# Patient Record
Sex: Male | Born: 1985 | Race: Black or African American | Hispanic: No | Marital: Single | Smoking: Current every day smoker
Health system: Southern US, Community
[De-identification: ages and names within clinical notes are randomized; demographics above are authoritative.]

## PROBLEM LIST (undated history)

## (undated) DIAGNOSIS — F419 Anxiety disorder, unspecified: Secondary | ICD-10-CM

## (undated) DIAGNOSIS — U071 COVID-19: Secondary | ICD-10-CM

## (undated) DIAGNOSIS — B20 Human immunodeficiency virus [HIV] disease: Secondary | ICD-10-CM

## (undated) DIAGNOSIS — F32A Depression, unspecified: Secondary | ICD-10-CM

## (undated) DIAGNOSIS — I10 Essential (primary) hypertension: Secondary | ICD-10-CM

## (undated) DIAGNOSIS — S62319A Displaced fracture of base of unspecified metacarpal bone, initial encounter for closed fracture: Secondary | ICD-10-CM

## (undated) DIAGNOSIS — F4310 Post-traumatic stress disorder, unspecified: Secondary | ICD-10-CM

## (undated) DIAGNOSIS — K219 Gastro-esophageal reflux disease without esophagitis: Secondary | ICD-10-CM

## (undated) HISTORY — DX: Anxiety disorder, unspecified: F41.9

## (undated) HISTORY — DX: Post-traumatic stress disorder, unspecified: F43.10

## (undated) HISTORY — PX: NO PAST SURGERIES: SHX2092

## (undated) HISTORY — DX: COVID-19: U07.1

## (undated) HISTORY — DX: Depression, unspecified: F32.A

## (undated) HISTORY — DX: Essential (primary) hypertension: I10

---

## 2015-04-19 ENCOUNTER — Emergency Department (HOSPITAL_COMMUNITY)
Admission: EM | Admit: 2015-04-19 | Discharge: 2015-04-19 | Disposition: A | Discharge status: Home / Self Care | Payer: Self-pay | Attending: Emergency Medicine | Admitting: Emergency Medicine

## 2015-04-19 ENCOUNTER — Encounter (HOSPITAL_COMMUNITY): Payer: Self-pay | Admitting: Emergency Medicine

## 2015-04-19 DIAGNOSIS — Z720 Tobacco use: Secondary | ICD-10-CM | POA: Insufficient documentation

## 2015-04-19 DIAGNOSIS — M25511 Pain in right shoulder: Secondary | ICD-10-CM

## 2015-04-19 DIAGNOSIS — M7521 Bicipital tendinitis, right shoulder: Secondary | ICD-10-CM | POA: Insufficient documentation

## 2015-04-19 MED ORDER — ONDANSETRON 4 MG PO TBDP
4.0000 mg | ORAL_TABLET | Freq: Once | ORAL | Status: AC
  Administered 2015-04-19: 4 mg via ORAL
  Filled 2015-04-19: qty 1

## 2015-04-19 MED ORDER — CYCLOBENZAPRINE HCL 10 MG PO TABS: 10.0000 mg | ORAL_TABLET | Freq: Two times a day (BID) | ORAL | Status: DC | PRN

## 2015-04-19 MED ORDER — OXYCODONE-ACETAMINOPHEN 5-325 MG PO TABS
2.0000 | ORAL_TABLET | Freq: Once | ORAL | Status: AC
  Administered 2015-04-19: 2 via ORAL
  Filled 2015-04-19: qty 2

## 2015-04-19 MED ORDER — NAPROXEN 375 MG PO TABS: 375.0000 mg | ORAL_TABLET | Freq: Two times a day (BID) | ORAL | Status: DC

## 2015-04-19 MED ORDER — HYDROCODONE-ACETAMINOPHEN 5-325 MG PO TABS: 2.0000 | ORAL_TABLET | Freq: Four times a day (QID) | ORAL | Status: DC | PRN

## 2015-04-19 NOTE — ED Provider Notes (Signed)
CSN: [id number]     Arrival date & time 04/19/15  1003 History  This chart was scribed for non-physician practitioner, Arthor Captain, PA-C, working with Richardean Canal, MD, by Ronney Lion, ED Scribe. This patient was seen in room TR10C/TR10C and the patient's care was started at 11:16 AM.      Chief Complaint  Patient presents with  . Shoulder Pain   The history is provided by the patient. No language interpreter was used.   HPI Comments: Allen [name]. is a 29 y.o. male who presents to the Emergency Department complaining of constant, bilateral shoulder pain, right greater than left, radiating up his neck that began 2 days ago. Patient states his left shoulder is moderately sore, but his right shoulder is severely stabbing in quality. He denies any specific trauma or injury but states he recently moved in to a new apartment, which required heavy lifting, and suspects he may have slept wrong. Patient has tried Tylenol PM, ibuprofen, and muscle relaxants with no relief. Patient states he played spots when he was younger and states he has injured his shoulder in the past, but the pain has never been as severe as it is today. Patient has NKDA. He states he is not driving today.  History reviewed. No pertinent past medical history. History reviewed. No pertinent past surgical history. No family history on file. Social History  Substance Use Topics  . Smoking status: Current Every Day Smoker -- 0.50 packs/day  . Smokeless tobacco: None  . Alcohol Use: 1.2 oz/week    2 Shots of liquor per week    Review of Systems  Musculoskeletal: Positive for arthralgias (bilateral shoulder pain, right greater than left).    Allergies  Review of patient's allergies indicates no known allergies.  Home Medications   Prior to Admission medications   Not on File   BP 144/88 mmHg  Pulse 72  Temp(Src) 98.4 F (36.9 C) (Oral)  Resp 20  Ht  (1.727 m)  Wt 214 lb 5 oz (97.212 kg)  BMI 32.59 kg/m2   SpO2 100% Physical Exam  Constitutional: He is oriented to person, place, and time. He appears well-developed and well-nourished. No distress.  HENT:  Head: Normocephalic and atraumatic.  Eyes: Conjunctivae and EOM are normal.  Neck: Neck supple. No tracheal deviation present.  Cardiovascular: Normal rate.   Pulmonary/Chest: Effort normal. No respiratory distress.  Musculoskeletal: He exhibits tenderness.  TTP over the proximal short head of the biceps tendon. Pain with any flexion, and unable to range the shoulder due to pain at this time. TTP to pec wall and trapezius with some muscle spasm, which is likely due to guarding.  Neurological: He is alert and oriented to person, place, and time.  Skin: Skin is warm and dry.  Psychiatric: He has a normal mood and affect. His behavior is normal.  Nursing note and vitals reviewed.   ED Course  Procedures (including critical care time)  DIAGNOSTIC STUDIES: Oxygen Saturation is 100% on RA, normal by my interpretation.    COORDINATION OF CARE: 11:20 AM - Suspect biceps tendinitis. Discussed treatment plan with pt at bedside which includes Rx anti-inflammatory medications, placement in a sling, and f/u with orthopedist if needed. Pt verbalized understanding and agreed to plan.   MDM   Final diagnoses:  None    Patient X-Ray negative for obvious fracture or dislocation. Pain managed in ED. Pt advised to follow up with orthopedics if symptoms persist for possibility of missed fracture  diagnosis. Patient given brace while in ED, conservative therapy recommended and discussed. Patient will be dc home & is agreeable with above plan.  I personally performed the services described in this documentation, which was scribed in my presence. The recorded information has been reviewed and is accurate.       Arthor Captain, PA-C 04/19/15 1645  Richardean Canal, MD 04/19/15 2019

## 2015-04-19 NOTE — Discharge Instructions (Signed)
Ice your shoulder 20 min at a time 3-4 times a day. Take the naproxen as directed. Use the norco only for severe pain.  Biceps Tendon Tendinitis (Proximal) and Tenosynovitis with Rehab Tendonitis and tenosynovitis involve inflammation of the tendon and the tendon lining (sheath). The proximal biceps tendon is vulnerable to tendonitis and tenosynovitis, which causes pain and discomfort in the front of the shoulder and upper arm. The tendon lining secretes a fluid that helps lubricate the tendon, allowing for proper function without pain. When the tendon and its lining become inflamed, the tendon can no longer glide smoothly, causing pain. The proximal biceps tendon connects the biceps muscle to two bones of the shoulder. It is important for proper function of the elbow and turning the palm upward (supination) using the wrist. Proximal biceps tendon tendinitis may include a grade 1 or 2 strain of the tendon. Grade 1 strains involve a slight pull of the tendon without signs of tearing and no observed tendon lengthening. There is also no loss of strength. Grade 2 strains involve small tears in the tendon fibers. The tendon or muscle is stretched and strength is usually decreased.  SYMPTOMS   Pain, tenderness, swelling, warmth, or redness over the front of the shoulder.  Pain that gets worse with shoulder and elbow use, especially against resistance.  Limited motion of the shoulder or elbow.  Crackling sound (crepitation) when the tendon or shoulder is moved or touched. CAUSES  The symptoms of biceps tendonitis are due to inflammation of the tendon. Inflammation may be caused by:  Strain from sudden increase in amount or intensity of activity.  Direct blow or injury to the elbow (uncommon).  Overuse or repetitive elbow bending or wrist rotation, particularly when turning the palm up, or with elbow hyperextension. RISK INCREASES WITH:  Sports that involve contact or overhead arm activity (throwing  sports, gymnastics, weightlifting, bodybuilding, rock climbing).  Heavy labor.  Poor strength and flexibility.  Failure to warm up properly before activity. PREVENTION  Warm up and stretch properly before activity.  Allow time for recovery between activities.  Maintain physical fitness:  Strength, flexibility, and endurance.  Cardiovascular fitness.  Learn and use proper exercise technique. PROGNOSIS  With proper treatment, proximal biceps tendon tendonitis and tenosynovitis is usually curable within 6 weeks. Healing is usually quicker if the cause was a direct blow, not overuse.  RELATED COMPLICATIONS   Longer healing time if not properly treated or if not given enough time to heal.  Chronically inflamed tendon that causes persistent pain with activity, that may progress to constant pain and potentially rupture of the tendon.  Recurring symptoms, especially if activity is resumed too soon or with overuse, a direct blow, or use of poor exercise technique. TREATMENT Treatment first involves ice and medicine, to reduce pain and inflammation. It is helpful to modify activities that cause pain, to reduce the chances of causing the condition to get worse. Strengthening and stretching exercises should be performed to promote proper use of the muscles of the shoulder. These exercises may be performed at home or with a therapist. Other treatments may be given such as ultrasound or heat therapy. A corticosteroid injection may be recommended to help reduce inflammation of the tendon lining. Surgery is usually not necessary. Sometimes, if symptoms last for greater than 6 months, surgery will be advised to detach the tendon and re-insert it into the arm bone. Surgery to correct other shoulder problems that may be contributing to tendinitis may be advised  before surgery for the tendinitis itself.  MEDICATION  If pain medicine is needed, nonsteroidal anti-inflammatory medicines (aspirin and  ibuprofen), or other minor pain relievers (acetaminophen), are often advised.  Do not take pain medicine for 7 days before surgery.  Prescription pain relievers may be given if your caregiver thinks they are needed. Use only as directed and only as much as you need.  Corticosteroid injections may be given. These injections should only be used on the most severe cases, as one can only receive a limited number of them. HEAT AND COLD   Cold treatment (icing) should be applied for 10 to 15 minutes every 2 to 3 hours for inflammation and pain, and immediately after activity that aggravates your symptoms. Use ice packs or an ice massage.  Heat treatment may be used before performing stretching and strengthening activities prescribed by your caregiver, physical therapist, or athletic trainer. Use a heat pack or a warm water soak. SEEK MEDICAL CARE IF:   Symptoms get worse or do not improve in 2 weeks, despite treatment.  New, unexplained symptoms develop. (Drugs used in treatment may produce side effects.) EXERCISES RANGE OF MOTION (ROM) AND EXERCISES - Biceps Tendon (Proximal) and Tenosynovitis These exercises may help you when beginning to rehabilitate your injury. Your symptoms may go away with or without further involvement from your physician, physical therapist, or athletic trainer. While completing these exercises, remember:   Restoring tissue flexibility helps normal motion to return to the joints. This allows healthier, less painful movement and activity.  An effective stretch should be held for at least 30 seconds.  A stretch should never be painful. You should only feel a gentle lengthening or release in the stretched tissue. STRETCH - Flexion, Standing  Stand with good posture. With an underhand grip on your right / left hand and an overhand grip on the opposite hand, grasp a broomstick or cane so that your hands are a little more than shoulder width apart.  Keeping your right /  left elbow straight and shoulder muscles relaxed, push the stick with your opposite hand to raise your right / left arm in front of your body and then overhead. Raise your arm until you feel a stretch in your right / left shoulder, but before you have increased shoulder pain.  Try to avoid shrugging your right / left shoulder as your arm rises, by keeping your shoulder blade tucked down and toward your mid-back spine. Hold for __________ seconds.  Slowly return to the starting position. Repeat __________ times. Complete this exercise __________ times per day. STRETCH - Abduction, Supine  Lie on your back. With an underhand grip on your right / left hand and an overhand grip on the opposite hand, grasp a broomstick or cane so that your hands are a little more than shoulder width apart.  Keeping your right / left elbow straight and shoulder muscles relaxed, push the stick with your opposite hand to raise your right / left arm out to the side of your body and then overhead. Raise your arm until you feel a stretch in your right / left shoulder, but before you have increased shoulder pain.  Try to avoid shrugging your right / left shoulder as your arm rises, by keeping your shoulder blade tucked down and toward your mid-back spine. Hold for __________ seconds.  Slowly return to the starting position. Repeat __________ times. Complete this exercise __________ times per day. ROM - Flexion, Active-Assisted  Lie on your back. You may  bend your knees for comfort.  Grasp a broomstick or cane so your hands are about shoulder width apart. Your right / left hand should grip the end of the stick so that your hand is positioned "thumbs-up," as if you were about to shake hands.  Using your healthy arm to lead, raise your right / left arm overhead until you feel a gentle stretch in your shoulder. Hold for __________ seconds.  Use the stick to assist in returning your right / left arm to its starting  position. Repeat __________ times. Complete this exercise __________ times per day.  STRETCH - Flexion, Standing   Stand facing a wall. Walk your right / left fingers up the wall until you feel a moderate stretch in your shoulder. As your hand gets higher, you may need to step closer to the wall or use a door frame to walk through.  Try to avoid shrugging your right / left shoulder as your arm rises, by keeping your shoulder blade tucked down and toward your mid-back spine.  Hold for __________ seconds. Use your other hand, if needed, to ease out of the stretch and return to the starting position. Repeat __________ times. Complete this exercise __________ times per day.  ROM - Internal Rotation   Using underhand grips, grasp a stick behind your back with both hands.  While standing upright with good posture, slide the stick up your back until you feel a mild stretch in the front of your shoulder.  Hold for __________ seconds. Slowly return to your starting position. Repeat __________ times. Complete this exercise __________ times per day.  STRETCH - Internal Rotation  Place your right / left hand behind your back, palm-up.  Throw a towel or belt over your opposite shoulder. Grasp the towel with your right / left hand.  While keeping an upright posture, gently pull up on the towel until you feel a stretch in the front of your right / left shoulder.  Avoid shrugging your right / left shoulder as your arm rises, by keeping your shoulder blade tucked down and toward your mid-back spine.  Hold for __________ seconds. Release the stretch by lowering your opposite hand. Repeat __________ times. Complete this exercise __________ times per day. STRENGTHENING EXERCISES - Biceps Tendon Tendinitis (Proximal) and Tenosynovitis These exercises may help you regain your strength after your physician has discontinued your restraint in a cast or brace. They may resolve your symptoms with or without  further involvement from your physician, physical therapist or athletic trainer. While completing these exercises, remember:   Muscles can gain both the endurance and the strength needed for everyday activities through controlled exercises.  Complete these exercises as instructed by your physician, physical therapist or athletic trainer. Increase the resistance and repetitions only as guided.  You may experience muscle soreness or fatigue, but the pain or discomfort you are trying to eliminate should never worsen during these exercises. If this pain does get worse, stop and make sure you are following the directions exactly. If the pain is still present after adjustments, discontinue the exercise until you can discuss the trouble with your caregiver. STRENGTH - Elbow Flexors, Isometric  Stand or sit upright on a firm surface. Place your right / left arm so that your hand is palm-up and at the height of your waist.  Place your opposite hand on top of your forearm. Gently push down as your right / left arm resists. Push as hard as you can with both arms, without  causing any pain or movement at your right / left elbow. Hold this stationary position for __________ seconds.  Gradually release the tension in both arms. Allow your muscles to relax completely before repeating. Repeat __________ times. Complete this exercise __________ times per day. STRENGTH - Shoulder Flexion, Isometric  With good posture and facing a wall, stand or sit about 4-6 inches away.  Keeping your right / left elbow straight, gently press the top of your fist into the wall. Increase the pressure gradually until you are pressing as hard as you can, without shrugging your shoulder or increasing any shoulder discomfort.  Hold for __________ seconds.  Release the tension slowly. Relax your shoulder muscles completely before you start the next repetition. Repeat __________ times. Complete this exercise __________ times per day.   STRENGTH - Elbow Flexors, Supinated  With good posture, stand or sit on a firm chair without armrests. Allow your right / left arm to rest at your side with your palm facing forward.  Holding a __________ weight, or gripping a rubber exercise band or tubing,  bring your hand toward your shoulder.  Allow your muscles to control the resistance as your hand returns to your side. Repeat __________ times. Complete this exercise __________ times per day.  STRENGTH - Shoulder Flexion  Stand or sit with good posture. Grasp a __________ weight, or an exercise band or tubing, so that your hand is "thumbs-up," like when you shake hands.  Slowly lift your right / left arm as far as you can, without increasing any shoulder pain. At first, many people can only raise their hand to shoulder height.  Avoid shrugging your right / left shoulder as your arm rises, by keeping your shoulder blade tucked down and toward your mid-back spine.  Hold for __________ seconds. Control the descent of your hand as you slowly return to your starting position. Repeat __________ times. Complete this exercise __________ times per day. Document Released: 07/26/2005 Document Revised: 10/18/2011 Document Reviewed: 11/07/2008 [facility] Patient Information 2015 [place], [place]. This information is not intended to replace advice given to you by your health care provider. Make sure you discuss any questions you have with your health care provider.  Shoulder Pain The shoulder is the joint that connects your arms to your body. The bones that form the shoulder joint include the upper arm bone (humerus), the shoulder blade (scapula), and the collarbone (clavicle). The top of the humerus is shaped like a ball and fits into a rather flat socket on the scapula (glenoid cavity). A combination of muscles and strong, fibrous tissues that connect muscles to bones (tendons) support your shoulder joint and hold the ball in the socket. Small,  fluid-filled sacs (bursae) are located in different areas of the joint. They act as cushions between the bones and the overlying soft tissues and help reduce friction between the gliding tendons and the bone as you move your arm. Your shoulder joint allows a wide range of motion in your arm. This range of motion allows you to do things like scratch your back or throw a ball. However, this range of motion also makes your shoulder more prone to pain from overuse and injury. Causes of shoulder pain can originate from both injury and overuse and usually can be grouped in the following four categories:  Redness, swelling, and pain (inflammation) of the tendon (tendinitis) or the bursae (bursitis).  Instability, such as a dislocation of the joint.  Inflammation of the joint (arthritis).  Broken bone (fracture).  HOME CARE INSTRUCTIONS   Apply ice to the sore area.  Put ice in a plastic bag.  Place a towel between your skin and the bag.  Leave the ice on for 15-20 minutes, 3-4 times per day for the first 2 days, or as directed by your health care provider.  Stop using cold packs if they do not help with the pain.  If you have a shoulder sling or immobilizer, wear it as long as your caregiver instructs. Only remove it to shower or bathe. Move your arm as little as possible, but keep your hand moving to prevent swelling.  Squeeze a soft ball or foam pad as much as possible to help prevent swelling.  Only take over-the-counter or prescription medicines for pain, discomfort, or fever as directed by your caregiver. SEEK MEDICAL CARE IF:   Your shoulder pain increases, or new pain develops in your arm, hand, or fingers.  Your hand or fingers become cold and numb.  Your pain is not relieved with medicines. SEEK IMMEDIATE MEDICAL CARE IF:   Your arm, hand, or fingers are numb or tingling.  Your arm, hand, or fingers are significantly swollen or turn white or blue. MAKE SURE YOU:   Understand  these instructions.  Will watch your condition.  Will get help right away if you are not doing well or get worse. Document Released: 05/05/2005 Document Revised: 12/10/2013 Document Reviewed: 07/10/2011 [facility] Patient Information 2015 [place], [place]. This information is not intended to replace advice given to you by your health care provider. Make sure you discuss any questions you have with your health care provider.

## 2015-04-19 NOTE — ED Notes (Signed)
Pt. Stated, I've been having shoulder pain for the last 2 days and it seems to be getting worse.  Last night it was so bafd I was crying.  Denies any injury.

## 2015-04-29 ENCOUNTER — Encounter (HOSPITAL_COMMUNITY): Payer: Self-pay | Admitting: *Deleted

## 2015-04-29 ENCOUNTER — Emergency Department (HOSPITAL_COMMUNITY): Payer: No Typology Code available for payment source

## 2015-04-29 ENCOUNTER — Emergency Department (HOSPITAL_COMMUNITY)
Admission: EM | Admit: 2015-04-29 | Discharge: 2015-04-29 | Disposition: A | Discharge status: Home / Self Care | Payer: No Typology Code available for payment source | Attending: Emergency Medicine | Admitting: Emergency Medicine

## 2015-04-29 DIAGNOSIS — Y9389 Activity, other specified: Secondary | ICD-10-CM | POA: Diagnosis not present

## 2015-04-29 DIAGNOSIS — S8992XA Unspecified injury of left lower leg, initial encounter: Secondary | ICD-10-CM | POA: Diagnosis not present

## 2015-04-29 DIAGNOSIS — Z720 Tobacco use: Secondary | ICD-10-CM | POA: Insufficient documentation

## 2015-04-29 DIAGNOSIS — Y999 Unspecified external cause status: Secondary | ICD-10-CM | POA: Insufficient documentation

## 2015-04-29 DIAGNOSIS — S4992XA Unspecified injury of left shoulder and upper arm, initial encounter: Secondary | ICD-10-CM | POA: Diagnosis not present

## 2015-04-29 DIAGNOSIS — Y92410 Unspecified street and highway as the place of occurrence of the external cause: Secondary | ICD-10-CM | POA: Insufficient documentation

## 2015-04-29 DIAGNOSIS — S3992XA Unspecified injury of lower back, initial encounter: Secondary | ICD-10-CM | POA: Insufficient documentation

## 2015-04-29 DIAGNOSIS — S0181XA Laceration without foreign body of other part of head, initial encounter: Secondary | ICD-10-CM

## 2015-04-29 DIAGNOSIS — S01111A Laceration without foreign body of right eyelid and periocular area, initial encounter: Secondary | ICD-10-CM | POA: Insufficient documentation

## 2015-04-29 DIAGNOSIS — Z791 Long term (current) use of non-steroidal anti-inflammatories (NSAID): Secondary | ICD-10-CM | POA: Insufficient documentation

## 2015-04-29 DIAGNOSIS — S0990XA Unspecified injury of head, initial encounter: Secondary | ICD-10-CM | POA: Diagnosis present

## 2015-04-29 DIAGNOSIS — S3991XA Unspecified injury of abdomen, initial encounter: Secondary | ICD-10-CM | POA: Diagnosis not present

## 2015-04-29 DIAGNOSIS — S299XXA Unspecified injury of thorax, initial encounter: Secondary | ICD-10-CM | POA: Diagnosis not present

## 2015-04-29 LAB — CBC WITH DIFFERENTIAL/PLATELET
BASOS ABS: 0 10*3/uL (ref 0.0–0.1)
BASOS PCT: 0 %
EOS ABS: 0.2 10*3/uL (ref 0.0–0.7)
EOS PCT: 2 %
HCT: 40.1 % (ref 39.0–52.0)
Hemoglobin: 13.4 g/dL (ref 13.0–17.0)
Lymphocytes Relative: 18 %
Lymphs Abs: 1.6 10*3/uL (ref 0.7–4.0)
MCH: 26.4 pg (ref 26.0–34.0)
MCHC: 33.4 g/dL (ref 30.0–36.0)
MCV: 79.1 fL (ref 78.0–100.0)
MONO ABS: 0.8 10*3/uL (ref 0.1–1.0)
MONOS PCT: 9 %
Neutro Abs: 5.9 10*3/uL (ref 1.7–7.7)
Neutrophils Relative %: 71 %
PLATELETS: 274 10*3/uL (ref 150–400)
RBC: 5.07 MIL/uL (ref 4.22–5.81)
RDW: 14.2 % (ref 11.5–15.5)
WBC: 8.5 10*3/uL (ref 4.0–10.5)

## 2015-04-29 LAB — COMPREHENSIVE METABOLIC PANEL
ALBUMIN: 4.4 g/dL (ref 3.5–5.0)
ALT: 28 U/L (ref 17–63)
ANION GAP: 7 (ref 5–15)
AST: 23 U/L (ref 15–41)
Alkaline Phosphatase: 76 U/L (ref 38–126)
BILIRUBIN TOTAL: 0.2 mg/dL — AB (ref 0.3–1.2)
BUN: 12 mg/dL (ref 6–20)
CHLORIDE: 102 mmol/L (ref 101–111)
CO2: 26 mmol/L (ref 22–32)
Calcium: 9.4 mg/dL (ref 8.9–10.3)
Creatinine, Ser: 1.14 mg/dL (ref 0.61–1.24)
GFR calc Af Amer: >60 mL/min (ref >60)
GLUCOSE: 96 mg/dL (ref 65–99)
POTASSIUM: 3.8 mmol/L (ref 3.5–5.1)
Sodium: 135 mmol/L (ref 135–145)
TOTAL PROTEIN: 7 g/dL (ref 6.5–8.1)

## 2015-04-29 LAB — LIPASE, BLOOD: LIPASE: 21 U/L — AB (ref 22–51)

## 2015-04-29 MED ORDER — OXYCODONE-ACETAMINOPHEN 5-325 MG PO TABS
1.0000 | ORAL_TABLET | Freq: Once | ORAL | Status: AC
Start: 2015-04-29 — End: 2015-04-29
  Administered 2015-04-29: 1 via ORAL
  Filled 2015-04-29: qty 1

## 2015-04-29 MED ORDER — TETANUS-DIPHTH-ACELL PERTUSSIS 5-2.5-18.5 LF-MCG/0.5 IM SUSP
0.5000 mL | Freq: Once | INTRAMUSCULAR | Status: AC
  Administered 2015-04-29: 0.5 mL via INTRAMUSCULAR
  Filled 2015-04-29: qty 0.5

## 2015-04-29 MED ORDER — IOHEXOL 300 MG/ML  SOLN
100.0000 mL | Freq: Once | INTRAMUSCULAR | Status: AC | PRN
  Administered 2015-04-29: 100 mL via INTRAVENOUS

## 2015-04-29 MED ORDER — LIDOCAINE-EPINEPHRINE 2 %-1:100000 IJ SOLN
20.0000 mL | Freq: Once | INTRAMUSCULAR | Status: AC
  Administered 2015-04-29: 20 mL
  Filled 2015-04-29: qty 20

## 2015-04-29 MED ORDER — DIAZEPAM 5 MG PO TABS: 5.0000 mg | ORAL_TABLET | Freq: Four times a day (QID) | ORAL | Status: DC | PRN
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
## 2015-04-29 NOTE — ED Notes (Signed)
Pt states he wants to leave AMA since he has been here too long. EDP notified. Attempted to calm pt down, pt still upset with the wait time

## 2015-04-29 NOTE — ED Notes (Signed)
Patient transported to X-ray 

## 2015-04-29 NOTE — ED Notes (Signed)
Pt refused ekg

## 2015-04-29 NOTE — Discharge Instructions (Signed)
Facial Laceration  A facial laceration is a cut on the face. These injuries can be painful and cause bleeding. Lacerations usually heal quickly, but they need special care to reduce scarring. DIAGNOSIS  Your health care provider will take a medical history, ask for details about how the injury occurred, and examine the wound to determine how deep the cut is. TREATMENT  Some facial lacerations may not require closure. Others may not be able to be closed because of an increased risk of infection. The risk of infection and the chance for successful closure will depend on various factors, including the amount of time since the injury occurred. The wound may be cleaned to help prevent infection. If closure is appropriate, pain medicines may be given if needed. Your health care provider will use stitches (sutures), wound glue (adhesive), or skin adhesive strips to repair the laceration. These tools bring the skin edges together to allow for faster healing and a better cosmetic outcome. If needed, you may also be given a tetanus shot. HOME CARE INSTRUCTIONS  Only take over-the-counter or prescription medicines as directed by your health care provider.  Follow your health care provider's instructions for wound care. These instructions will vary depending on the technique used for closing the wound. For Sutures:  Keep the wound clean and dry.   If you were given a bandage (dressing), you should change it at least once a day. Also change the dressing if it becomes wet or dirty, or as directed by your health care provider.   Wash the wound with soap and water 2 times a day. Rinse the wound off with water to remove all soap. Pat the wound dry with a clean towel.   After cleaning, apply a thin layer of the antibiotic ointment recommended by your health care provider. This will help prevent infection and keep the dressing from sticking.   You may shower as usual after the first 24 hours. Do not soak the  wound in water until the sutures are removed.   Get your sutures removed as directed by your health care provider. With facial lacerations, sutures should usually be taken out after 4-5 days to avoid stitch marks.   Wait a few days after your sutures are removed before applying any makeup. For Skin Adhesive Strips:  Keep the wound clean and dry.   Do not get the skin adhesive strips wet. You may bathe carefully, using caution to keep the wound dry.   If the wound gets wet, pat it dry with a clean towel.   Skin adhesive strips will fall off on their own. You may trim the strips as the wound heals. Do not remove skin adhesive strips that are still stuck to the wound. They will fall off in time.  For Wound Adhesive:  You may briefly wet your wound in the shower or bath. Do not soak or scrub the wound. Do not swim. Avoid periods of heavy sweating until the skin adhesive has fallen off on its own. After showering or bathing, gently pat the wound dry with a clean towel.   Do not apply liquid medicine, cream medicine, ointment medicine, or makeup to your wound while the skin adhesive is in place. This may loosen the film before your wound is healed.   If a dressing is placed over the wound, be careful not to apply tape directly over the skin adhesive. This may cause the adhesive to be pulled off before the wound is healed.   Avoid   prolonged exposure to sunlight or tanning lamps while the skin adhesive is in place.  The skin adhesive will usually remain in place for 5-10 days, then naturally fall off the skin. Do not pick at the adhesive film.  After Healing: Once the wound has healed, cover the wound with sunscreen during the day for 1 full year. This can help minimize scarring. Exposure to ultraviolet light in the first year will darken the scar. It can take 1-2 years for the scar to lose its redness and to heal completely.  SEEK IMMEDIATE MEDICAL CARE IF:  You have redness, pain, or  swelling around the wound.   You see ayellowish-white fluid (pus) coming from the wound.   You have chills or a fever.  MAKE SURE YOU:  Understand these instructions.  Will watch your condition.  Will get help right away if you are not doing well or get worse. Document Released: 09/02/2004 Document Revised: 05/16/2013 Document Reviewed: 03/08/2013 [employer] Patient Information 2015 [employer]. This information is not intended to replace advice given to you by your health care provider. Make sure you discuss any questions you have with your health care provider.  

## 2015-04-29 NOTE — ED Notes (Signed)
RN into room, pt refusing to keep C-collar on at this time, states he is claustrophobic, pt educated.
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
## 2015-04-29 NOTE — ED Notes (Signed)
Pt to go into E44

## 2015-05-05 ENCOUNTER — Encounter (HOSPITAL_COMMUNITY): Payer: Self-pay | Admitting: Cardiology

## 2015-05-05 ENCOUNTER — Emergency Department (HOSPITAL_COMMUNITY)
Admission: EM | Admit: 2015-05-05 | Discharge: 2015-05-05 | Disposition: A | Discharge status: Home / Self Care | Payer: Self-pay | Attending: Emergency Medicine | Admitting: Emergency Medicine

## 2015-05-05 DIAGNOSIS — Z720 Tobacco use: Secondary | ICD-10-CM | POA: Insufficient documentation

## 2015-05-05 DIAGNOSIS — Z4802 Encounter for removal of sutures: Secondary | ICD-10-CM | POA: Insufficient documentation

## 2015-05-05 DIAGNOSIS — J069 Acute upper respiratory infection, unspecified: Secondary | ICD-10-CM | POA: Insufficient documentation

## 2015-05-05 DIAGNOSIS — R1110 Vomiting, unspecified: Secondary | ICD-10-CM | POA: Insufficient documentation

## 2015-05-05 MED ORDER — MAGIC MOUTHWASH: 5.0000 mL | Freq: Three times a day (TID) | ORAL | Status: DC | PRN

## 2015-05-05 MED ORDER — NAPROXEN 500 MG PO TABS: 500.0000 mg | ORAL_TABLET | Freq: Two times a day (BID) | ORAL | Status: DC

## 2015-05-05 MED ORDER — FLUTICASONE PROPIONATE 50 MCG/ACT NA SUSP: 2.0000 | Freq: Every day | NASAL | Status: DC

## 2015-05-05 NOTE — ED Notes (Signed)
Pt reports cough, sore throat and SOB since Thursday. Reports he was seen at [facility] office and given naproxen. Reports he is not feeling any better.

## 2015-05-05 NOTE — ED Notes (Signed)
Declined W/C at D/C and was escorted to lobby by RN. 

## 2015-05-05 NOTE — ED Notes (Signed)
Suture removal kit bedside per PA request. Pt requesting eye brow stitches removed.
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
## 2015-05-05 NOTE — Discharge Instructions (Signed)
Use nasal spray as directed. Use Magic mouthwash as directed as needed for pain. Take naproxen as prescribed as needed for pain.  Suture Removal, Care After Refer to this sheet in the next few weeks. These instructions provide you with information on caring for yourself after your procedure. Your health care provider may also give you more specific instructions. Your treatment has been planned according to current medical practices, but problems sometimes occur. Call your health care provider if you have any problems or questions after your procedure. WHAT TO EXPECT AFTER THE PROCEDURE After your stitches (sutures) are removed, it is typical to have the following:  Some discomfort and swelling in the wound area.  Slight redness in the area. HOME CARE INSTRUCTIONS   If you have skin adhesive strips over the wound area, do not take the strips off. They will fall off on their own in a few days. If the strips remain in place after 14 days, you may remove them.  Change any bandages (dressings) at least once a day or as directed by your health care provider. If the bandage sticks, soak it off with warm, soapy water.  Apply cream or ointment only as directed by your health care provider. If using cream or ointment, wash the area with soap and water 2 times a day to remove all the cream or ointment. Rinse off the soap and pat the area dry with a clean towel.  Keep the wound area dry and clean. If the bandage becomes wet or dirty, or if it develops a bad smell, change it as soon as possible.  Continue to protect the wound from injury.  Use sunscreen when out in the sun. New scars become sunburned easily. SEEK MEDICAL CARE IF:  You have increasing redness, swelling, or pain in the wound.  You see pus coming from the wound.  You have a fever.  You notice a bad smell coming from the wound or dressing.  Your wound breaks open (edges not staying together). Document Released: 04/20/2001 Document  Revised: 05/16/2013 Document Reviewed: 03/07/2013 [facility] Patient Information 2015 [place], [place]. This information is not intended to replace advice given to you by your health care provider. Make sure you discuss any questions you have with your health care provider.  Upper Respiratory Infection, Adult An upper respiratory infection (URI) is also sometimes known as the common cold. The upper respiratory tract includes the nose, sinuses, throat, trachea, and bronchi. Bronchi are the airways leading to the lungs. Most people improve within 1 week, but symptoms can last up to 2 weeks. A residual cough may last even longer.  CAUSES Many different viruses can infect the tissues lining the upper respiratory tract. The tissues become irritated and inflamed and often become very moist. Mucus production is also common. A cold is contagious. You can easily spread the virus to others by oral contact. This includes kissing, sharing a glass, coughing, or sneezing. Touching your mouth or nose and then touching a surface, which is then touched by another person, can also spread the virus. SYMPTOMS  Symptoms typically develop 1 to 3 days after you come in contact with a cold virus. Symptoms vary from person to person. They may include:  Runny nose.  Sneezing.  Nasal congestion.  Sinus irritation.  Sore throat.  Loss of voice (laryngitis).  Cough.  Fatigue.  Muscle aches.  Loss of appetite.  Headache.  Low-grade fever. DIAGNOSIS  You might diagnose your own cold based on familiar symptoms, since most  people get a cold 2 to 3 times a year. Your caregiver can confirm this based on your exam. Most importantly, your caregiver can check that your symptoms are not due to another disease such as strep throat, sinusitis, pneumonia, asthma, or epiglottitis. Blood tests, throat tests, and X-rays are not necessary to diagnose a common cold, but they may sometimes be helpful in excluding other more serious  diseases. Your caregiver will decide if any further tests are required. RISKS AND COMPLICATIONS  You may be at risk for a more severe case of the common cold if you smoke cigarettes, have chronic heart disease (such as heart failure) or lung disease (such as asthma), or if you have a weakened immune system. The very young and very old are also at risk for more serious infections. Bacterial sinusitis, middle ear infections, and bacterial pneumonia can complicate the common cold. The common cold can worsen asthma and chronic obstructive pulmonary disease (COPD). Sometimes, these complications can require emergency medical care and may be life-threatening. PREVENTION  The best way to protect against getting a cold is to practice good hygiene. Avoid oral or hand contact with people with cold symptoms. Wash your hands often if contact occurs. There is no clear evidence that vitamin C, vitamin E, echinacea, or exercise reduces the chance of developing a cold. However, it is always recommended to get plenty of rest and practice good nutrition. TREATMENT  Treatment is directed at relieving symptoms. There is no cure. Antibiotics are not effective, because the infection is caused by a virus, not by bacteria. Treatment may include:  Increased fluid intake. Sports drinks offer valuable electrolytes, sugars, and fluids.  Breathing heated mist or steam (vaporizer or shower).  Eating chicken soup or other clear broths, and maintaining good nutrition.  Getting plenty of rest.  Using gargles or lozenges for comfort.  Controlling fevers with ibuprofen or acetaminophen as directed by your caregiver.  Increasing usage of your inhaler if you have asthma. Zinc gel and zinc lozenges, taken in the first 24 hours of the common cold, can shorten the duration and lessen the severity of symptoms. Pain medicines may help with fever, muscle aches, and throat pain. A variety of non-prescription medicines are available to  treat congestion and runny nose. Your caregiver can make recommendations and may suggest nasal or lung inhalers for other symptoms.  HOME CARE INSTRUCTIONS   Only take over-the-counter or prescription medicines for pain, discomfort, or fever as directed by your caregiver.  Use a warm mist humidifier or inhale steam from a shower to increase air moisture. This may keep secretions moist and make it easier to breathe.  Drink enough water and fluids to keep your urine clear or pale yellow.  Rest as needed.  Return to work when your temperature has returned to normal or as your caregiver advises. You may need to stay home longer to avoid infecting others. You can also use a face mask and careful hand washing to prevent spread of the virus. SEEK MEDICAL CARE IF:   After the first few days, you feel you are getting worse rather than better.  You need your caregiver's advice about medicines to control symptoms.  You develop chills, worsening shortness of breath, or brown or red sputum. These may be signs of pneumonia.  You develop yellow or brown nasal discharge or pain in the face, especially when you bend forward. These may be signs of sinusitis.  You develop a fever, swollen neck glands, pain with swallowing,  or white areas in the back of your throat. These may be signs of strep throat. SEEK IMMEDIATE MEDICAL CARE IF:   You have a fever.  You develop severe or persistent headache, ear pain, sinus pain, or chest pain.  You develop wheezing, a prolonged cough, cough up blood, or have a change in your usual mucus (if you have chronic lung disease).  You develop sore muscles or a stiff neck. Document Released: 01/19/2001 Document Revised: 10/18/2011 Document Reviewed: 10/31/2013 [facility] Patient Information 2015 [place], [place]. This information is not intended to replace advice given to you by your health care provider. Make sure you discuss any questions you have with your health care  provider.

## 2015-05-06 ENCOUNTER — Ambulatory Visit: Payer: Self-pay | Admitting: Family Medicine

## 2015-06-15 ENCOUNTER — Emergency Department (HOSPITAL_COMMUNITY)
Admission: EM | Admit: 2015-06-15 | Discharge: 2015-06-15 | Disposition: A | Discharge status: Home / Self Care | Payer: Self-pay | Attending: Emergency Medicine | Admitting: Emergency Medicine

## 2015-06-15 ENCOUNTER — Encounter (HOSPITAL_COMMUNITY): Payer: Self-pay | Admitting: *Deleted

## 2015-06-15 DIAGNOSIS — Z7951 Long term (current) use of inhaled steroids: Secondary | ICD-10-CM | POA: Insufficient documentation

## 2015-06-15 DIAGNOSIS — Z720 Tobacco use: Secondary | ICD-10-CM | POA: Insufficient documentation

## 2015-06-15 DIAGNOSIS — Z791 Long term (current) use of non-steroidal anti-inflammatories (NSAID): Secondary | ICD-10-CM | POA: Insufficient documentation

## 2015-06-15 DIAGNOSIS — H6691 Otitis media, unspecified, right ear: Secondary | ICD-10-CM | POA: Insufficient documentation

## 2015-06-15 MED ORDER — NAPROXEN 500 MG PO TABS
500.0000 mg | ORAL_TABLET | Freq: Two times a day (BID) | ORAL | Status: DC
Start: 2015-06-15 — End: 2015-12-08

## 2015-06-15 MED ORDER — AMOXICILLIN 500 MG PO CAPS: 500.0000 mg | ORAL_CAPSULE | Freq: Three times a day (TID) | ORAL | Status: DC

## 2015-06-15 MED ORDER — AMOXICILLIN-POT CLAVULANATE 875-125 MG PO TABS
1.0000 | ORAL_TABLET | Freq: Once | ORAL | Status: AC
  Administered 2015-06-15: 1 via ORAL
  Filled 2015-06-15: qty 1

## 2015-06-15 MED ORDER — IBUPROFEN 400 MG PO TABS
800.0000 mg | ORAL_TABLET | Freq: Once | ORAL | Status: AC
Start: 2015-06-15 — End: 2015-06-15
  Administered 2015-06-15: 800 mg via ORAL
  Filled 2015-06-15: qty 2

## 2015-06-15 MED ORDER — ONDANSETRON 4 MG PO TBDP
4.0000 mg | ORAL_TABLET | Freq: Once | ORAL | Status: AC
  Administered 2015-06-15: 4 mg via ORAL
  Filled 2015-06-15: qty 1

## 2015-06-15 NOTE — ED Notes (Signed)
PT reports the light makes his RT ear hurt.

## 2015-06-15 NOTE — ED Notes (Signed)
Pt reports when he woke up at 0730 this AM his RT ear hurt. Pt reports he went back to sleep and woke later with worse pain.

## 2015-06-15 NOTE — ED Provider Notes (Signed)
CSN: [id number]     Arrival date & time 06/15/15  1153 History  By signing my name below, I, Octavia Heir, attest that this documentation has been prepared under the direction and in the presence of Fayrene Helper, PA-C. Electronically Signed: Octavia Heir, ED Scribe. 06/15/2015. 12:04 PM.    Chief Complaint  Patient presents with  . Otalgia      The history is provided by the patient. No language interpreter was used.   HPI Comments: Allen [name]. is a 29 y.o. male who presents to the Emergency Department complaining of constant, gradual worsening right ear pain onset about 5 hours ago. He describes the pain as a throbbing, aching sensation. Pt states he woke up this morning and felt a pop in his ear and reports the pain increasing severely. Pt notes the pain radiates to his right temple and his right jaw. Pt states he put peroxide in his ear but has not taken any medication to alleviate the pain. He denies hearing changes, ear drainage, and fever.  No past medical history on file. No past surgical history on file. No family history on file. Social History  Substance Use Topics  . Smoking status: Current Every Day Smoker -- 0.50 packs/day  . Smokeless tobacco: Not on file  . Alcohol Use: 1.2 oz/week    2 Shots of liquor per week    Review of Systems  Constitutional: Negative for fever.  HENT: Positive for ear pain. Negative for ear discharge and hearing loss.       Allergies  Review of patient's allergies indicates no known allergies.  Home Medications   Prior to Admission medications   Medication Sig Start Date End Date Taking? Authorizing Provider  cyclobenzaprine (FLEXERIL) 10 MG tablet Take 1 tablet (10 mg total) by mouth 2 (two) times daily as needed for muscle spasms. 04/19/15   Abigail Harris, PA-C  diazepam (VALIUM) 5 MG tablet Take 1 tablet (5 mg total) by mouth every 6 (six) hours as needed for muscle spasms. 04/29/15   Mirian Mo, MD  fluticasone (FLONASE)  50 MCG/ACT nasal spray Place 2 sprays into both nostrils daily. 05/05/15   Robyn M Hess, PA-C  ibuprofen (ADVIL,MOTRIN) 200 MG tablet Take 400 mg by mouth every 6 (six) hours as needed for mild pain.    Historical Provider, MD  magic mouthwash SOLN Take 5 mLs by mouth 3 (three) times daily as needed for mouth pain. 05/05/15   Robyn M Hess, PA-C  naproxen (NAPROSYN) 500 MG tablet Take 1 tablet (500 mg total) by mouth 2 (two) times daily. 05/05/15   Kathrynn Speed, PA-C   Triage vitals: BP 147/123 mmHg  Pulse 127  Temp(Src) 98.4 F (36.9 C) (Oral)  Resp 18  SpO2 99% Physical Exam  Constitutional: He appears well-developed and well-nourished. No distress.  HENT:  Head: Normocephalic and atraumatic.  TM is erythematous and retracted but intact, ear canals normal, postauricular lymphadenopathy noted, no TMJ noted  Eyes: Right eye exhibits no discharge. Left eye exhibits no discharge.  Pulmonary/Chest: Effort normal. No respiratory distress.  Neurological: He is alert. Coordination normal.  Skin: No rash noted. He is not diaphoretic.  Psychiatric: He has a normal mood and affect. His behavior is normal.  Nursing note and vitals reviewed.   ED Course  Procedures  DIAGNOSTIC STUDIES: Oxygen Saturation is 99% on RA, normal by my interpretation.  COORDINATION OF CARE:  12:03 PM acute onset of R ear pain.  Evidence of R ear  infection.  No evidence of TMJ, no dental pain.   Discussed treatment plan with pt at bedside and pt agreed to plan.    MDM   Final diagnoses:  Acute right otitis media, recurrence not specified, unspecified otitis media type    BP 146/96 mmHg  Pulse 96  Temp(Src) 98 F (36.7 C) (Oral)  Resp 16  SpO2 100%  I personally performed the services described in this documentation, which was scribed in my presence. The recorded information has been reviewed and is accurate.     Fayrene HelperBowie Tamanika Heiney, PA-C 06/15/15 1604  Eber HongBrian Miller, MD 06/15/15 2025

## 2015-06-15 NOTE — ED Notes (Signed)
Declined W/C at D/C and was escorted to lobby by RN. 

## 2015-06-15 NOTE — Discharge Instructions (Signed)

## 2015-11-08 DIAGNOSIS — S62319A Displaced fracture of base of unspecified metacarpal bone, initial encounter for closed fracture: Secondary | ICD-10-CM

## 2015-11-08 HISTORY — DX: Displaced fracture of base of unspecified metacarpal bone, initial encounter for closed fracture: S62.319A

## 2015-11-18 ENCOUNTER — Emergency Department (HOSPITAL_COMMUNITY): Payer: Self-pay

## 2015-11-18 ENCOUNTER — Encounter (HOSPITAL_COMMUNITY): Payer: Self-pay | Admitting: *Deleted

## 2015-11-18 ENCOUNTER — Emergency Department (HOSPITAL_COMMUNITY)
Admission: EM | Admit: 2015-11-18 | Discharge: 2015-11-18 | Disposition: A | Discharge status: Home / Self Care | Payer: Self-pay | Attending: Emergency Medicine | Admitting: Emergency Medicine

## 2015-11-18 DIAGNOSIS — R000 Tachycardia, unspecified: Secondary | ICD-10-CM | POA: Insufficient documentation

## 2015-11-18 DIAGNOSIS — Y9289 Other specified places as the place of occurrence of the external cause: Secondary | ICD-10-CM | POA: Insufficient documentation

## 2015-11-18 DIAGNOSIS — F419 Anxiety disorder, unspecified: Secondary | ICD-10-CM | POA: Insufficient documentation

## 2015-11-18 DIAGNOSIS — Y999 Unspecified external cause status: Secondary | ICD-10-CM | POA: Insufficient documentation

## 2015-11-18 DIAGNOSIS — W2209XA Striking against other stationary object, initial encounter: Secondary | ICD-10-CM | POA: Insufficient documentation

## 2015-11-18 DIAGNOSIS — Y9389 Activity, other specified: Secondary | ICD-10-CM | POA: Insufficient documentation

## 2015-11-18 DIAGNOSIS — F17200 Nicotine dependence, unspecified, uncomplicated: Secondary | ICD-10-CM | POA: Insufficient documentation

## 2015-11-18 DIAGNOSIS — R0602 Shortness of breath: Secondary | ICD-10-CM | POA: Insufficient documentation

## 2015-11-18 DIAGNOSIS — S60221A Contusion of right hand, initial encounter: Secondary | ICD-10-CM | POA: Insufficient documentation

## 2015-11-18 MED ORDER — HYDROCODONE-ACETAMINOPHEN 5-325 MG PO TABS
2.0000 | ORAL_TABLET | Freq: Once | ORAL | Status: AC
  Administered 2015-11-18: 2 via ORAL
  Filled 2015-11-18: qty 2

## 2015-11-18 MED ORDER — IBUPROFEN 800 MG PO TABS: 800.0000 mg | ORAL_TABLET | Freq: Three times a day (TID) | ORAL | Status: DC

## 2015-11-18 MED ORDER — IBUPROFEN 800 MG PO TABS
800.0000 mg | ORAL_TABLET | Freq: Once | ORAL | Status: AC
  Administered 2015-11-18: 800 mg via ORAL
  Filled 2015-11-18: qty 1

## 2015-11-18 MED ORDER — HYDROCODONE-ACETAMINOPHEN 5-325 MG PO TABS: 1.0000 | ORAL_TABLET | Freq: Four times a day (QID) | ORAL | Status: DC | PRN

## 2015-11-18 NOTE — Discharge Instructions (Signed)
Take motrin for pain.   Take vicodin for severe pain. Do NOT drive with it,.   Use splint for comfort.   Apply ice to help with swelling  No heavy lifting with right hand for a week.  See your doctor.   Return to ER if you have worse shortness of breath, anxiety, hand swelling, numbness in fingers.

## 2015-11-18 NOTE — ED Notes (Signed)
Per Lawyer, PA-C, change of acuity to 3.   More of chest pain and shortness of breath work up needs to be done.
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
## 2015-11-18 NOTE — Progress Notes (Signed)
Orthopedic Tech Progress Note Patient Details:  Allen PlanVictor Domingo Jr. 11/21/1985 [id number]  Ortho Devices Type of Ortho Device: Ace wrap, Ulna gutter splint Ortho Device/Splint Location: rue Ortho Device/Splint Interventions: Application   Allen [name] 11/18/2015, 10:15 AM
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
## 2015-12-01 ENCOUNTER — Encounter (HOSPITAL_COMMUNITY): Payer: Self-pay | Admitting: Emergency Medicine

## 2015-12-01 ENCOUNTER — Emergency Department (HOSPITAL_COMMUNITY)
Admission: EM | Admit: 2015-12-01 | Discharge: 2015-12-01 | Disposition: A | Discharge status: Home / Self Care | Payer: No Typology Code available for payment source | Attending: Emergency Medicine | Admitting: Emergency Medicine

## 2015-12-01 ENCOUNTER — Emergency Department (HOSPITAL_COMMUNITY): Payer: No Typology Code available for payment source

## 2015-12-01 DIAGNOSIS — F17200 Nicotine dependence, unspecified, uncomplicated: Secondary | ICD-10-CM | POA: Insufficient documentation

## 2015-12-01 DIAGNOSIS — Y998 Other external cause status: Secondary | ICD-10-CM | POA: Insufficient documentation

## 2015-12-01 DIAGNOSIS — Y9389 Activity, other specified: Secondary | ICD-10-CM | POA: Insufficient documentation

## 2015-12-01 DIAGNOSIS — S62309A Unspecified fracture of unspecified metacarpal bone, initial encounter for closed fracture: Secondary | ICD-10-CM

## 2015-12-01 DIAGNOSIS — W2209XA Striking against other stationary object, initial encounter: Secondary | ICD-10-CM | POA: Insufficient documentation

## 2015-12-01 DIAGNOSIS — Z791 Long term (current) use of non-steroidal anti-inflammatories (NSAID): Secondary | ICD-10-CM | POA: Insufficient documentation

## 2015-12-01 DIAGNOSIS — Y9289 Other specified places as the place of occurrence of the external cause: Secondary | ICD-10-CM | POA: Insufficient documentation

## 2015-12-01 DIAGNOSIS — S62316A Displaced fracture of base of fifth metacarpal bone, right hand, initial encounter for closed fracture: Secondary | ICD-10-CM | POA: Insufficient documentation

## 2015-12-01 DIAGNOSIS — Z7951 Long term (current) use of inhaled steroids: Secondary | ICD-10-CM | POA: Insufficient documentation

## 2015-12-01 MED ORDER — OXYCODONE-ACETAMINOPHEN 5-325 MG PO TABS
1.0000 | ORAL_TABLET | Freq: Once | ORAL | Status: AC
Start: 2015-12-01 — End: 2015-12-01
  Administered 2015-12-01: 1 via ORAL
  Filled 2015-12-01: qty 1

## 2015-12-01 MED ORDER — IBUPROFEN 800 MG PO TABS: 800.0000 mg | ORAL_TABLET | Freq: Three times a day (TID) | ORAL | Status: DC

## 2015-12-01 MED ORDER — IBUPROFEN 400 MG PO TABS
800.0000 mg | ORAL_TABLET | Freq: Once | ORAL | Status: AC
  Administered 2015-12-01: 800 mg via ORAL
  Filled 2015-12-01: qty 2

## 2015-12-01 MED ORDER — HYDROCODONE-ACETAMINOPHEN 5-325 MG PO TABS: 1.0000 | ORAL_TABLET | Freq: Four times a day (QID) | ORAL | Status: DC | PRN

## 2015-12-01 NOTE — ED Notes (Signed)
Pt states he was here for treatment of R wrist pain x 2 weeks ago and was placed in a hard splint. He states the splint came off in his sleep 1 week ago and has since been experiencing increased pain. Pt moves extremity well. + pulses and sensation noted.

## 2015-12-01 NOTE — ED Notes (Signed)
Ortho tech called for splint.

## 2015-12-01 NOTE — Discharge Instructions (Signed)
1. Medications: Ibuprofen as needed for pain, use narcotic pain medication only as needed for breakthrough pain - This can make you very drowsy - please do not drink or drive on this medication, continue usual home medications 2. Treatment: rest, drink plenty of fluids, keep splint on until you see the hand surgeon 3. Follow Up: Please call the hand surgeon listed today or tomorrow to schedule an appointment. We recommend he schedule an appointment before Friday of this week. Please return to the ER for new or worsening symptoms, any additional concerns.

## 2015-12-01 NOTE — Progress Notes (Signed)
Orthopedic Tech Progress Note Patient Details:  Allen PlanVictor Oregel Jr. 09/28/1985 [id number]  Ortho Devices Type of Ortho Device: Ace wrap, Ulna gutter splint Ortho Device/Splint Location: rue Ortho Device/Splint Interventions: Application   Nikki DomCrawford, Marcellus Pulliam 12/01/2015, 9:49 AM

## 2015-12-01 NOTE — ED Notes (Signed)
Declined W/C at D/C and was escorted to lobby by RN. 
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
## 2015-12-05 ENCOUNTER — Other Ambulatory Visit: Payer: Self-pay | Admitting: Orthopedic Surgery

## 2015-12-08 ENCOUNTER — Encounter (HOSPITAL_BASED_OUTPATIENT_CLINIC_OR_DEPARTMENT_OTHER): Payer: Self-pay | Admitting: *Deleted

## 2015-12-09 ENCOUNTER — Ambulatory Visit (HOSPITAL_BASED_OUTPATIENT_CLINIC_OR_DEPARTMENT_OTHER)
Admission: RE | Admit: 2015-12-09 | Discharge: 2015-12-09 | Disposition: A | Discharge status: Home / Self Care | Payer: Self-pay | Source: Ambulatory Visit | Attending: Orthopedic Surgery | Admitting: Orthopedic Surgery

## 2015-12-09 ENCOUNTER — Encounter (HOSPITAL_BASED_OUTPATIENT_CLINIC_OR_DEPARTMENT_OTHER): Payer: Self-pay

## 2015-12-09 ENCOUNTER — Ambulatory Visit (HOSPITAL_BASED_OUTPATIENT_CLINIC_OR_DEPARTMENT_OTHER): Payer: Self-pay | Admitting: Certified Registered"

## 2015-12-09 ENCOUNTER — Encounter (HOSPITAL_BASED_OUTPATIENT_CLINIC_OR_DEPARTMENT_OTHER)
Admission: RE | Disposition: A | Discharge status: Home / Self Care | Payer: Self-pay | Source: Ambulatory Visit | Attending: Orthopedic Surgery

## 2015-12-09 DIAGNOSIS — Z6833 Body mass index (BMI) 33.0-33.9, adult: Secondary | ICD-10-CM | POA: Insufficient documentation

## 2015-12-09 DIAGNOSIS — F17210 Nicotine dependence, cigarettes, uncomplicated: Secondary | ICD-10-CM | POA: Insufficient documentation

## 2015-12-09 DIAGNOSIS — X58XXXA Exposure to other specified factors, initial encounter: Secondary | ICD-10-CM | POA: Insufficient documentation

## 2015-12-09 DIAGNOSIS — E669 Obesity, unspecified: Secondary | ICD-10-CM | POA: Insufficient documentation

## 2015-12-09 DIAGNOSIS — S62316A Displaced fracture of base of fifth metacarpal bone, right hand, initial encounter for closed fracture: Secondary | ICD-10-CM | POA: Insufficient documentation

## 2015-12-09 HISTORY — DX: Gastro-esophageal reflux disease without esophagitis: K21.9

## 2015-12-09 HISTORY — DX: Displaced fracture of base of unspecified metacarpal bone, initial encounter for closed fracture: S62.319A

## 2015-12-09 HISTORY — PX: OPEN REDUCTION INTERNAL FIXATION (ORIF) METACARPAL: SHX6234

## 2015-12-09 SURGERY — OPEN REDUCTION INTERNAL FIXATION (ORIF) METACARPAL
Anesthesia: General | Site: Finger | Laterality: Right

## 2015-12-09 MED ORDER — MIDAZOLAM HCL 2 MG/2ML IJ SOLN
1.0000 mg | INTRAMUSCULAR | Status: DC | PRN
  Administered 2015-12-09: 2 mg via INTRAVENOUS

## 2015-12-09 MED ORDER — 0.9 % SODIUM CHLORIDE (POUR BTL) OPTIME
TOPICAL | Status: DC | PRN
  Administered 2015-12-09: 200 mL

## 2015-12-09 MED ORDER — OXYCODONE HCL 5 MG PO TABS
10.0000 mg | ORAL_TABLET | Freq: Once | ORAL | Status: AC
  Administered 2015-12-09: 10 mg via ORAL

## 2015-12-09 MED ORDER — CEFAZOLIN SODIUM-DEXTROSE 2-4 GM/100ML-% IV SOLN
2.0000 g | INTRAVENOUS | Status: AC
  Administered 2015-12-09: 2 g via INTRAVENOUS

## 2015-12-09 MED ORDER — FENTANYL CITRATE (PF) 100 MCG/2ML IJ SOLN
50.0000 ug | INTRAMUSCULAR | Status: AC | PRN
  Administered 2015-12-09: 50 ug via INTRAVENOUS
  Administered 2015-12-09: 100 ug via INTRAVENOUS
  Administered 2015-12-09 (×3): 50 ug via INTRAVENOUS

## 2015-12-09 MED ORDER — GLYCOPYRROLATE 0.2 MG/ML IJ SOLN: 0.2000 mg | Freq: Once | INTRAMUSCULAR | Status: DC | PRN

## 2015-12-09 MED ORDER — SCOPOLAMINE 1 MG/3DAYS TD PT72: 1.0000 | MEDICATED_PATCH | Freq: Once | TRANSDERMAL | Status: DC | PRN

## 2015-12-09 MED ORDER — FENTANYL CITRATE (PF) 100 MCG/2ML IJ SOLN
INTRAMUSCULAR | Status: AC
  Filled 2015-12-09: qty 2

## 2015-12-09 MED ORDER — LIDOCAINE HCL (CARDIAC) 20 MG/ML IV SOLN
INTRAVENOUS | Status: DC | PRN
  Administered 2015-12-09: 50 mg via INTRAVENOUS

## 2015-12-09 MED ORDER — OXYCODONE-ACETAMINOPHEN 5-325 MG PO TABS: ORAL_TABLET | ORAL | Status: DC

## 2015-12-09 MED ORDER — DEXAMETHASONE SODIUM PHOSPHATE 10 MG/ML IJ SOLN
INTRAMUSCULAR | Status: DC | PRN
  Administered 2015-12-09: 10 mg via INTRAVENOUS

## 2015-12-09 MED ORDER — CEFAZOLIN SODIUM-DEXTROSE 2-4 GM/100ML-% IV SOLN
INTRAVENOUS | Status: AC
  Filled 2015-12-09: qty 100

## 2015-12-09 MED ORDER — HYDROMORPHONE HCL 1 MG/ML IJ SOLN
0.2500 mg | INTRAMUSCULAR | Status: DC | PRN
  Administered 2015-12-09 (×2): 0.5 mg via INTRAVENOUS

## 2015-12-09 MED ORDER — BUPIVACAINE HCL (PF) 0.25 % IJ SOLN
INTRAMUSCULAR | Status: DC | PRN
  Administered 2015-12-09: 7 mL

## 2015-12-09 MED ORDER — CHLORHEXIDINE GLUCONATE 4 % EX LIQD: 60.0000 mL | Freq: Once | CUTANEOUS | Status: DC

## 2015-12-09 MED ORDER — ONDANSETRON HCL 4 MG/2ML IJ SOLN
INTRAMUSCULAR | Status: DC | PRN
  Administered 2015-12-09: 4 mg via INTRAVENOUS

## 2015-12-09 MED ORDER — FENTANYL CITRATE (PF) 100 MCG/2ML IJ SOLN
INTRAMUSCULAR | Status: AC
Start: 2015-12-09 — End: 2015-12-09
  Filled 2015-12-09: qty 2

## 2015-12-09 MED ORDER — KETOROLAC TROMETHAMINE 30 MG/ML IJ SOLN
INTRAMUSCULAR | Status: AC
  Filled 2015-12-09: qty 1

## 2015-12-09 MED ORDER — ONDANSETRON HCL 4 MG/2ML IJ SOLN: 4.0000 mg | Freq: Once | INTRAMUSCULAR | Status: DC | PRN

## 2015-12-09 MED ORDER — MIDAZOLAM HCL 2 MG/2ML IJ SOLN
INTRAMUSCULAR | Status: AC
  Filled 2015-12-09: qty 2

## 2015-12-09 MED ORDER — MEPERIDINE HCL 25 MG/ML IJ SOLN: 6.2500 mg | INTRAMUSCULAR | Status: DC | PRN

## 2015-12-09 MED ORDER — OXYCODONE HCL 5 MG PO TABS
ORAL_TABLET | ORAL | Status: AC
  Filled 2015-12-09: qty 2

## 2015-12-09 MED ORDER — LACTATED RINGERS IV SOLN
INTRAVENOUS | Status: DC
  Administered 2015-12-09 (×2): via INTRAVENOUS

## 2015-12-09 MED ORDER — KETOROLAC TROMETHAMINE 30 MG/ML IJ SOLN
30.0000 mg | Freq: Once | INTRAMUSCULAR | Status: AC
  Administered 2015-12-09: 30 mg via INTRAVENOUS

## 2015-12-09 MED ORDER — HYDROMORPHONE HCL 1 MG/ML IJ SOLN
INTRAMUSCULAR | Status: AC
  Filled 2015-12-09: qty 1

## 2015-12-09 MED ORDER — FENTANYL CITRATE (PF) 100 MCG/2ML IJ SOLN
25.0000 ug | INTRAMUSCULAR | Status: DC | PRN
  Administered 2015-12-09: 25 ug via INTRAVENOUS
  Administered 2015-12-09 (×2): 50 ug via INTRAVENOUS

## 2015-12-09 MED ORDER — PROPOFOL 10 MG/ML IV BOLUS
INTRAVENOUS | Status: DC | PRN
  Administered 2015-12-09: 200 mg via INTRAVENOUS

## 2015-12-09 SURGICAL SUPPLY — 51 items
BANDAGE ACE 3X5.8 VEL STRL LF (GAUZE/BANDAGES/DRESSINGS) ×3 IMPLANT
BLADE SURG 15 STRL LF DISP TIS (BLADE) ×2 IMPLANT
BLADE SURG 15 STRL SS (BLADE) ×4
BNDG ESMARK 4X9 LF (GAUZE/BANDAGES/DRESSINGS) ×3 IMPLANT
BNDG GAUZE ELAST 4 BULKY (GAUZE/BANDAGES/DRESSINGS) ×3 IMPLANT
BONE CHIP PRESERV 5CC PCAN5 (Bone Implant) ×3 IMPLANT
CHLORAPREP W/TINT 26ML (MISCELLANEOUS) ×3 IMPLANT
CORDS BIPOLAR (ELECTRODE) ×3 IMPLANT
COVER BACK TABLE 60X90IN (DRAPES) ×3 IMPLANT
COVER MAYO STAND STRL (DRAPES) ×3 IMPLANT
CUFF TOURNIQUET SINGLE 18IN (TOURNIQUET CUFF) ×3 IMPLANT
DRAPE EXTREMITY T 121X128X90 (DRAPE) ×3 IMPLANT
DRAPE OEC MINIVIEW 54X84 (DRAPES) ×3 IMPLANT
DRAPE SURG 17X23 STRL (DRAPES) ×3 IMPLANT
GAUZE SPONGE 4X4 12PLY STRL (GAUZE/BANDAGES/DRESSINGS) ×3 IMPLANT
GAUZE XEROFORM 1X8 LF (GAUZE/BANDAGES/DRESSINGS) ×3 IMPLANT
GLOVE BIO SURGEON STRL SZ7.5 (GLOVE) ×3 IMPLANT
GLOVE BIOGEL PI IND STRL 7.0 (GLOVE) ×2 IMPLANT
GLOVE BIOGEL PI IND STRL 8 (GLOVE) ×1 IMPLANT
GLOVE BIOGEL PI IND STRL 8.5 (GLOVE) ×1 IMPLANT
GLOVE BIOGEL PI INDICATOR 7.0 (GLOVE) ×4
GLOVE BIOGEL PI INDICATOR 8 (GLOVE) ×2
GLOVE BIOGEL PI INDICATOR 8.5 (GLOVE) ×2
GLOVE ECLIPSE 6.5 STRL STRAW (GLOVE) ×3 IMPLANT
GLOVE SURG ORTHO 8.0 STRL STRW (GLOVE) ×3 IMPLANT
GOWN STRL REUS W/ TWL LRG LVL3 (GOWN DISPOSABLE) ×1 IMPLANT
GOWN STRL REUS W/TWL LRG LVL3 (GOWN DISPOSABLE) ×2
GOWN STRL REUS W/TWL XL LVL3 (GOWN DISPOSABLE) ×6 IMPLANT
K-WIRE .045X4 (WIRE) ×6 IMPLANT
NEEDLE HYPO 25X1 1.5 SAFETY (NEEDLE) ×3 IMPLANT
NS IRRIG 1000ML POUR BTL (IV SOLUTION) ×3 IMPLANT
PACK BASIN DAY SURGERY FS (CUSTOM PROCEDURE TRAY) ×3 IMPLANT
PAD CAST 4YDX4 CTTN HI CHSV (CAST SUPPLIES) ×1 IMPLANT
PADDING CAST COTTON 4X4 STRL (CAST SUPPLIES) ×2
SLEEVE SCD COMPRESS KNEE MED (MISCELLANEOUS) ×3 IMPLANT
SPLINT PLASTER CAST XFAST 3X15 (CAST SUPPLIES) IMPLANT
SPLINT PLASTER CAST XFAST 4X15 (CAST SUPPLIES) IMPLANT
SPLINT PLASTER XTRA FAST SET 4 (CAST SUPPLIES)
SPLINT PLASTER XTRA FASTSET 3X (CAST SUPPLIES)
STOCKINETTE 4X48 STRL (DRAPES) ×3 IMPLANT
SUT CHROMIC 4 0 PS 2 18 (SUTURE) ×3 IMPLANT
SUT ETHILON 3 0 PS 1 (SUTURE) IMPLANT
SUT ETHILON 4 0 PS 2 18 (SUTURE) ×3 IMPLANT
SUT MERSILENE 4 0 P 3 (SUTURE) IMPLANT
SUT VIC AB 3-0 PS1 18 (SUTURE)
SUT VIC AB 3-0 PS1 18XBRD (SUTURE) IMPLANT
SUT VICRYL 4-0 PS2 18IN ABS (SUTURE) IMPLANT
SYR BULB 3OZ (MISCELLANEOUS) ×3 IMPLANT
SYR CONTROL 10ML LL (SYRINGE) ×3 IMPLANT
TOWEL OR 17X24 6PK STRL BLUE (TOWEL DISPOSABLE) ×6 IMPLANT
UNDERPAD 30X30 (UNDERPADS AND DIAPERS) ×3 IMPLANT

## 2015-12-09 NOTE — Discharge Instructions (Addendum)

## 2015-12-09 NOTE — Brief Op Note (Signed)
12/09/2015  2:42 PM  PATIENT:  Allen PlanVictor Yarbough Jr.  30 y.o. male  PRE-OPERATIVE DIAGNOSIS:  Right small finger metacarpal base fracture/dislocation S62.316A  POST-OPERATIVE DIAGNOSIS:  Right small finger metacarpal base fracture/dislocation S62.316A  PROCEDURE:  Procedure(s): OPEN REDUCTION INTERNAL FIXATION (ORIF) RIGHT SMALL FINGER/METACARPAL (Right)  SURGEON:  Surgeon(s) and Role:    * Betha LoaKevin Bertin Inabinet, MD - Primary    * Cindee SaltGary Alyana Kreiter, MD - Assisting  PHYSICIAN ASSISTANT:   ASSISTANTS: Cindee SaltGary Dyllen Menning, MD   ANESTHESIA:   general  EBL:  Total I/O In: 1000 [I.V.:1000] Out: -   BLOOD ADMINISTERED:none  DRAINS: none   LOCAL MEDICATIONS USED:  MARCAINE     SPECIMEN:  No Specimen  DISPOSITION OF SPECIMEN:  N/A  COUNTS:  YES  TOURNIQUET:   Total Tourniquet Time Documented: Upper Arm (Right) - 40 minutes Total: Upper Arm (Right) - 40 minutes   DICTATION: .Other Dictation: Dictation Number [id number]  PLAN OF CARE: Discharge to home after PACU  PATIENT DISPOSITION:  PACU - hemodynamically stable.

## 2015-12-09 NOTE — Op Note (Signed)
Intra-operative fluoroscopic images in the AP, lateral, and oblique views were taken and evaluated by myself.  Reduction and hardware placement were confirmed.  There was no intraarticular penetration of permanent hardware.  

## 2015-12-09 NOTE — Transfer of Care (Signed)
Immediate Anesthesia Transfer of Care Note  Patient: Allen PlanVictor Linford Jr.  Procedure(s) Performed: Procedure(s): OPEN REDUCTION INTERNAL FIXATION (ORIF) RIGHT SMALL FINGER/METACARPAL (Right)  Patient Location: PACU  Anesthesia Type:General  Level of Consciousness: awake, sedated and confused  Airway & Oxygen Therapy: Patient Spontanous Breathing and Patient connected to face mask oxygen  Post-op Assessment: Report given to RN and Post -op Vital signs reviewed and stable  Post vital signs: Reviewed and stable  Last Vitals:  Filed Vitals:   12/09/15 1143  BP: 138/97  Pulse: 90  Temp: 36.8 C  Resp: 18    Last Pain:  Filed Vitals:   12/09/15 1144  PainSc: 6       Patients Stated Pain Goal: 4 (12/09/15 1143)  Complications: No apparent anesthesia complications

## 2015-12-09 NOTE — Anesthesia Preprocedure Evaluation (Signed)
Anesthesia Evaluation  Patient identified by MRN, date of birth, ID band Patient awake    Reviewed: Allergy & Precautions, NPO status , Patient's Chart, lab work & pertinent test results  Airway Mallampati: III  TM Distance: >3 FB Neck ROM: Full    Dental no notable dental hx. (+) Teeth Intact   Pulmonary Current Smoker,    Pulmonary exam normal breath sounds clear to auscultation       Cardiovascular negative cardio ROS   Rhythm:Regular     Neuro/Psych Depression Had ECT x2 in pastnegative neurological ROS     GI/Hepatic Neg liver ROS, GERD  Medicated and Controlled,  Endo/Other  Obesity  Renal/GU negative Renal ROS  negative genitourinary   Musculoskeletal Closed Fx right 5th digit   Abdominal (+) + obese,   Peds  Hematology negative hematology ROS (+)   Anesthesia Other Findings   Reproductive/Obstetrics                             Anesthesia Physical Anesthesia Plan  ASA: II  Anesthesia Plan: General   Post-op Pain Management:    Induction: Intravenous  Airway Management Planned: LMA  Additional Equipment:   Intra-op Plan:   Post-operative Plan: Extubation in OR  Informed Consent: I have reviewed the patients History and Physical, chart, labs and discussed the procedure including the risks, benefits and alternatives for the proposed anesthesia with the patient or authorized representative who has indicated his/her understanding and acceptance.   Dental advisory given  Plan Discussed with: Anesthesiologist, CRNA and Surgeon  Anesthesia Plan Comments:         Anesthesia Quick Evaluation

## 2015-12-09 NOTE — Op Note (Signed)
[id number] 

## 2015-12-09 NOTE — Anesthesia Postprocedure Evaluation (Addendum)
Anesthesia Post Note  Patient: Allen PlanVictor Mccambridge Jr.  Procedure(s) Performed: Procedure(s) (LRB): OPEN REDUCTION INTERNAL FIXATION (ORIF) RIGHT SMALL FINGER/METACARPAL (Right)  Patient location during evaluation: PACU Anesthesia Type: General Level of consciousness: awake and alert and oriented Pain management: pain level controlled Vital Signs Assessment: post-procedure vital signs reviewed and stable Respiratory status: spontaneous breathing, nonlabored ventilation and respiratory function stable Cardiovascular status: blood pressure returned to baseline and stable Postop Assessment: no signs of nausea or vomiting Anesthetic complications: no    Last Vitals:  Filed Vitals:   12/09/15 1500 12/09/15 1515  BP: 132/76 129/89  Pulse: 93 87  Temp:    Resp: 19 13    Last Pain:  Filed Vitals:   12/09/15 1520  PainSc: 10-Worst pain ever                 Kaliah Haddaway A.

## 2015-12-09 NOTE — Op Note (Signed)
I assisted Surgeon(s) and Role:    * Betha LoaKevin Taura Lamarre, MD - Primary    * Cindee SaltGary Nakayla Rorabaugh, MD - Assisting on the Procedure(s): OPEN REDUCTION INTERNAL FIXATION (ORIF) RIGHT SMALL FINGER/METACARPAL on 12/09/2015.  I provided assistance on this case as follows: approach, retraction, identification of the fracture ,reduction and maintenance of the reduction, bone grafting, fixation placement, closure and application of the dressing and splints. I was present for the entire case.  Electronically signed by: Nicki ReaperKUZMA,Adilen Pavelko R, MD Date: 12/09/2015 Time: 2:43 PM
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
## 2015-12-09 NOTE — Anesthesia Procedure Notes (Signed)
Procedure Name: LMA Insertion Date/Time: 12/09/2015 1:45 PM Performed by: Zenia ResidesPAYNE, Oriel Ojo D Pre-anesthesia Checklist: Patient identified, Emergency Drugs available, Suction available and Patient being monitored Patient Re-evaluated:Patient Re-evaluated prior to inductionOxygen Delivery Method: Circle System Utilized Preoxygenation: Pre-oxygenation with 100% oxygen Intubation Type: IV induction Ventilation: Mask ventilation without difficulty LMA: LMA inserted LMA Size: 5.0 Number of attempts: 1 Airway Equipment and Method: Bite block Placement Confirmation: positive ETCO2 Tube secured with: Tape Dental Injury: Teeth and Oropharynx as per pre-operative assessment

## 2015-12-10 ENCOUNTER — Encounter (HOSPITAL_BASED_OUTPATIENT_CLINIC_OR_DEPARTMENT_OTHER): Payer: Self-pay | Admitting: Orthopedic Surgery
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
## 2015-12-30 ENCOUNTER — Encounter (HOSPITAL_COMMUNITY): Payer: Self-pay

## 2015-12-30 ENCOUNTER — Emergency Department (HOSPITAL_COMMUNITY)
Admission: EM | Admit: 2015-12-30 | Discharge: 2015-12-30 | Disposition: A | Discharge status: Home / Self Care | Payer: Self-pay | Attending: Emergency Medicine | Admitting: Emergency Medicine

## 2015-12-30 ENCOUNTER — Emergency Department (HOSPITAL_COMMUNITY): Payer: Self-pay

## 2015-12-30 DIAGNOSIS — Z79899 Other long term (current) drug therapy: Secondary | ICD-10-CM | POA: Insufficient documentation

## 2015-12-30 DIAGNOSIS — Z8719 Personal history of other diseases of the digestive system: Secondary | ICD-10-CM | POA: Insufficient documentation

## 2015-12-30 DIAGNOSIS — Z9889 Other specified postprocedural states: Secondary | ICD-10-CM | POA: Insufficient documentation

## 2015-12-30 DIAGNOSIS — Z791 Long term (current) use of non-steroidal anti-inflammatories (NSAID): Secondary | ICD-10-CM | POA: Insufficient documentation

## 2015-12-30 DIAGNOSIS — Z8781 Personal history of (healed) traumatic fracture: Secondary | ICD-10-CM | POA: Insufficient documentation

## 2015-12-30 DIAGNOSIS — Z5189 Encounter for other specified aftercare: Secondary | ICD-10-CM

## 2015-12-30 DIAGNOSIS — Z4801 Encounter for change or removal of surgical wound dressing: Secondary | ICD-10-CM | POA: Insufficient documentation

## 2015-12-30 DIAGNOSIS — F17210 Nicotine dependence, cigarettes, uncomplicated: Secondary | ICD-10-CM | POA: Insufficient documentation

## 2015-12-30 MED ORDER — IBUPROFEN 400 MG PO TABS
600.0000 mg | ORAL_TABLET | Freq: Once | ORAL | Status: AC
  Administered 2015-12-30: 600 mg via ORAL
  Filled 2015-12-30: qty 1

## 2015-12-30 MED ORDER — DOXYCYCLINE HYCLATE 100 MG PO CAPS: 100.0000 mg | ORAL_CAPSULE | Freq: Two times a day (BID) | ORAL | Status: DC

## 2015-12-30 MED ORDER — OXYCODONE-ACETAMINOPHEN 5-325 MG PO TABS
1.0000 | ORAL_TABLET | Freq: Once | ORAL | Status: AC
  Administered 2015-12-30: 1 via ORAL
  Filled 2015-12-30: qty 1

## 2015-12-30 NOTE — ED Notes (Signed)
Patient here to have pins checked in right hand. Had surgery early may and thinks pin pushed in to far. Called [employer] surveyorhand surgeon but had no transportation to office

## 2015-12-30 NOTE — Discharge Instructions (Signed)
You have been seen today for reevaluation of your hand pins. Your imaging showed no abnormalities. Follow up with Dr. Merlyn LotKuzma in the office tomorrow. Go to the appointment set up for you at 9:15 AM tomorrow morning. You have been prescribed an antibiotic. Please take all of your antibiotics until finished!   You may develop abdominal discomfort or diarrhea from the antibiotic.  You may help offset this with probiotics which you can buy or get in yogurt. Do not eat or take the probiotics until 2 hours after your antibiotic.

## 2015-12-30 NOTE — ED Provider Notes (Signed)
CSN: [id number]     Arrival date & time 12/30/15  1046 History  By signing my name below, I, Allen [name], attest that this documentation has been prepared under the direction and in the presence of Allen Meggett, PA-C. Electronically Signed: Octavia HeirArianna [name], ED Scribe. 12/30/2015. 12:43 PM.    Chief Complaint  Patient presents with  . recheck pin in hand       The history is provided by the patient. No language interpreter was used.    HPI Comments: Allen PlanVictor Parlato Jr. is a 30 y.o. male who presents to the Emergency Department complaining of constant, gradual worsening, 8/10, burning, right hand pain with associated swelling onset two weeks ago. Pt reports having right hand surgery by Allen [name] on 12/09/15. Last Friday pt notes having stitches taken out with a brace put on and that the pins in his hand have been pushed further into his skin since then. Pt has tried taking out the pins himself but states it did not help. Pt notes seeing pus-like drainage from his hand earlier this morning. He says he called Allen [name]'s office and was told to come to the office today but he was unable to go due to transportation. Pt is scheduled to have pins taken out on 6/9. Pt was prescribed percocet but states he didn't get it filled due to monetary issues. He denies fever/chills, nausea/vomiting, neuro deficits, or any other complaints.    Past Medical History  Diagnosis Date  . GERD (gastroesophageal reflux disease)     no current med.  . Fracture of metacarpal base of right hand, closed 11/2015    right small finger   Past Surgical History  Procedure Laterality Date  . No past surgeries    . Open reduction internal fixation (orif) metacarpal Right 12/09/2015    Procedure: OPEN REDUCTION INTERNAL FIXATION (ORIF) RIGHT SMALL FINGER/METACARPAL;  Surgeon: Allen LoaKevin Kuzma, MD;  Location: [facility];  Service: Orthopedics;  Laterality: Right;   No family history on file. Social History  Substance Use  Topics  . Smoking status: Current Every Day Smoker -- 0.50 packs/day for 10 years    Types: Cigarettes  . Smokeless tobacco: Never Used  . Alcohol Use: Yes     Comment: socially    Review of Systems  Constitutional: Negative for fever and chills.  Musculoskeletal: Positive for arthralgias. Negative for joint swelling.  Skin: Negative for color change.  Neurological: Negative for weakness and numbness.      Allergies  Review of patient's allergies indicates no known allergies.  Home Medications   Prior to Admission medications   Medication Sig Start Date End Date Taking? Authorizing Provider  albuterol (PROVENTIL HFA;VENTOLIN HFA) 108 (90 Base) MCG/ACT inhaler Inhale into the lungs every 6 (six) hours as needed for wheezing or shortness of breath.    Historical Provider, MD  doxycycline (VIBRAMYCIN) 100 MG capsule Take 1 capsule (100 mg total) by mouth 2 (two) times daily. 12/30/15   Allen Gish C Nekeshia Lenhardt, PA-C  ibuprofen (ADVIL,MOTRIN) 800 MG tablet Take 1 tablet (800 mg total) by mouth 3 (three) times daily. 12/01/15   Allen PicketJaime Pilcher Ward, PA-C  oxyCODONE-acetaminophen (PERCOCET) 5-325 MG tablet 1-2 tabs po q6 hours prn pain 12/09/15   Allen LoaKevin Kuzma, MD   Triage vitals: BP 143/87 mmHg  Pulse 97  Temp(Src) 98.3 F (36.8 C) (Oral)  Resp 18  SpO2 98% Physical Exam  Constitutional: He appears well-developed and well-nourished. No distress.  HENT:  Head: Normocephalic and atraumatic.  Eyes: Conjunctivae are normal.  Neck: Neck supple.  Cardiovascular: Normal rate and regular rhythm.   Pulmonary/Chest: Effort normal.  Musculoskeletal:  Full ROM in right wrist and right hand, circulation intact. 2 pins coming from skin on medial side of right hand, no active hemmorage or exudate  1 pins flushed with skin and other pertruding 0.5 cm  Neurological: He is alert.  5/5 strength, no sensory deficits  Skin: Skin is warm and dry. He is not diaphoretic.  Nursing note and vitals reviewed.   ED  Course  Procedures  DIAGNOSTIC STUDIES: Oxygen Saturation is 98% on RA, normal by my interpretation.  COORDINATION OF CARE:  12:41 PM Will call Dr. Merlyn Lot. Discussed treatment plan with pt at bedside and pt agreed to plan.   Imaging Review Dg Hand Complete Right  12/30/2015  CLINICAL DATA:  Status post fixation of right hand boxer's fracture. Subsequent encounter. EXAM: RIGHT HAND - COMPLETE 3+ VIEW COMPARISON:  12/01/2015 FINDINGS: Two K-wires have been placed through the proximal and midshaft of the fifth metacarpal. The more distal K-wire terminates in the shaft of the fourth metacarpal, while the more proximal wire traverses the proximal shaft of the fourth metacarpal terminating between the third and fourth metacarpals. No lucency is seen about the wires to suggest loosening or infection. Subtle deformity is again seen involving the base of the fifth metacarpal related to the previously described fracture with likely a small amount of interval callus formation. There has been some interval demineralization involving the bases of the fourth and fifth metacarpals as well as the adjacent hamate. Mild osteopenia is also seen elsewhere in the hand, likely related to disuse and with most prominent involvement of the distal shaft of the fifth metacarpal. There is mild soft tissue swelling about the hand. There is no dislocation. IMPRESSION: Interval K-wire fixation of the fifth metacarpal. Mild callus formation about the fifth metacarpal base fracture. Electronically Signed   By: Allen [name] M.D.   On: 12/30/2015 13:47   I have personally reviewed and evaluated these images as part of my medical decision-making.   EKG Interpretation None      MDM   Final diagnoses:  Visit for wound check    Allen [name]. presents for examination of his postop wound and pins.  No signs of local or systemic infection. Pins seem to be solidly in place. Good range of motion in the patient's hand. Dr.  Merlyn Lot contacted. 12:53 PM Spoke to Dr. Merlyn Lot, hand surgeon on call and the surgeon who perfromed the patient's surgery. Requested hand xray. Dr. Merlyn Lot called back after reviewing the x-ray. States the pins are still in place. Requests the patient follow up with him in the office tomorrow. Patient confirms that he will have transportation tomorrow. Requesting patient be placed in a resting hand splint and prescribed doxycycline. Patient has an appointment with Dr. Merlyn Lot at 9:15 AM tomorrow morning. Patient was encouraged to keep this appointment. Further home care and return precautions discussed. Patient voiced understanding of these instructions, accepts the plan, and is comfortable with discharge.  Filed Vitals:   12/30/15 1113  BP: 143/87  Pulse: 97  Temp: 98.3 F (36.8 C)  TempSrc: Oral  Resp: 18  SpO2: 98%    I personally performed the services described in this documentation, which was scribed in my presence. The recorded information has been reviewed and is accurate.   Anselm Pancoast, PA-C 12/30/15 1702  Lorre Nick, MD 12/30/15 [phone]

## 2015-12-30 NOTE — Progress Notes (Signed)
Orthopedic Tech Progress Note Patient Details:  [name] PlanVictor Kohrs Jr. 02/04/1986 [id number] Called [employer] for brace order. Patient ID: [name] PlanVictor Meunier Jr., male   DOB: 08/08/1986, 30 y.o.   MRN: [id number]   Jennye MoccasinHughes, Vega Withrow Craig 12/30/2015, 3:53 PM

## 2015-12-30 NOTE — Progress Notes (Signed)
Orthopedic Tech Progress Note Patient Details:  Allen PlanVictor Flow Jr. 12/12/1985 [id number] Brace order completed by [employer] vendor. Patient ID: Allen PlanVictor Fleeman Jr., male   DOB: 01/21/1986, 30 y.o.   MRN: [id number]   Allen [name], Allen [name] 12/30/2015, 4:27 PM
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
## 2018-12-24 ENCOUNTER — Ambulatory Visit (HOSPITAL_COMMUNITY)
Admission: EM | Admit: 2018-12-24 | Discharge: 2018-12-24 | Disposition: A | Discharge status: Home / Self Care | Payer: Self-pay | Attending: Emergency Medicine | Admitting: Emergency Medicine

## 2018-12-24 ENCOUNTER — Encounter (HOSPITAL_COMMUNITY): Payer: Self-pay

## 2018-12-24 ENCOUNTER — Other Ambulatory Visit: Payer: Self-pay

## 2018-12-24 DIAGNOSIS — H1012 Acute atopic conjunctivitis, left eye: Secondary | ICD-10-CM

## 2018-12-24 HISTORY — DX: Human immunodeficiency virus (HIV) disease: B20

## 2018-12-24 MED ORDER — OLOPATADINE HCL 0.1 % OP SOLN: 1.0000 [drp] | Freq: Two times a day (BID) | OPHTHALMIC | 0 refills | Status: DC

## 2018-12-24 MED ORDER — TETRACAINE HCL 0.5 % OP SOLN
OPHTHALMIC | Status: AC
  Filled 2018-12-24: qty 4

## 2018-12-24 MED ORDER — FLUORESCEIN SODIUM 1 MG OP STRP
ORAL_STRIP | OPHTHALMIC | Status: AC
  Filled 2018-12-24: qty 1

## 2018-12-24 NOTE — Discharge Instructions (Signed)
Please use olopatadine eyedrops twice daily to help with itching and irritation Avoid touching eye May apply warm compresses Please follow-up if developing eye pain, increased redness, drainage or changes in vision, swelling around eye

## 2018-12-24 NOTE — ED Provider Notes (Signed)
[facility]    CSN: [id number] Arrival date & time: 12/24/18  1508     History   Chief Complaint Chief Complaint  Patient presents with  . Conjunctivitis    HPI Allen [name]. is a 33 y.o. male history of HIV, GERD, presenting today for evaluation of possible pinkeye.  Patient states that his housemates have had symptoms concerning for pinkeye-red eyes and swelling over the past couple days.  He notes that last night he started to have some itching and irritation in his eye.  He felt his left eye was redder than his right.  He woke up with some mild crusting and has felt he has had drainage throughout the day.  He denies any changes in vision or eye pain.  Does wear glasses, denies contact use.  Denies associated URI symptoms of cough or sore throat.  Has had some mild congestion from his allergies.  Denies fevers.  HPI  Past Medical History:  Diagnosis Date  . Fracture of metacarpal base of right hand, closed 11/2015   right small finger  . GERD (gastroesophageal reflux disease)    no current med.  Marland Kitchen HIV (human immunodeficiency virus infection) (HCC)     There are no active problems to display for this patient.   Past Surgical History:  Procedure Laterality Date  . NO PAST SURGERIES    . OPEN REDUCTION INTERNAL FIXATION (ORIF) METACARPAL Right 12/09/2015   Procedure: OPEN REDUCTION INTERNAL FIXATION (ORIF) RIGHT SMALL FINGER/METACARPAL;  Surgeon: Betha Loa, MD;  Location: [facility];  Service: Orthopedics;  Laterality: Right;       Home Medications    Prior to Admission medications   Medication Sig Start Date End Date Taking? Authorizing Provider  albuterol (PROVENTIL HFA;VENTOLIN HFA) 108 (90 Base) MCG/ACT inhaler Inhale into the lungs every 6 (six) hours as needed for wheezing or shortness of breath.    [provider]  doxycycline (VIBRAMYCIN) 100 MG capsule Take 1 capsule (100 mg total) by mouth 2 (two) times daily. 12/30/15    Joy, Shawn C, PA-C  ibuprofen (ADVIL,MOTRIN) 800 MG tablet Take 1 tablet (800 mg total) by mouth 3 (three) times daily. 12/01/15   Ward, Chase Picket, PA-C  olopatadine (PATANOL) 0.1 % ophthalmic solution Place 1 drop into both eyes 2 (two) times daily. 12/24/18   Wieters, Junius Creamer, PA-C  oxyCODONE-acetaminophen (PERCOCET) 5-325 MG tablet 1-2 tabs po q6 hours prn pain 12/09/15   Betha Loa, MD    Family History No family history on file.  Social History Social History   Tobacco Use  . Smoking status: Current Every Day Smoker    Packs/day: 0.50    Years: 10.00    Pack years: 5.00    Types: Cigarettes  . Smokeless tobacco: Never Used  Substance Use Topics  . Alcohol use: Yes    Comment: socially  . Drug use: No     Allergies   Patient has no known allergies.   Review of Systems Review of Systems  Constitutional: Negative for activity change, appetite change, chills, fatigue and fever.  HENT: Negative for congestion, ear pain, rhinorrhea, sinus pressure, sore throat and trouble swallowing.   Eyes: Positive for discharge, redness and itching. Negative for photophobia and visual disturbance.  Respiratory: Negative for cough, chest tightness and shortness of breath.   Cardiovascular: Negative for chest pain.  Gastrointestinal: Negative for abdominal pain, diarrhea, nausea and vomiting.  Musculoskeletal: Negative for myalgias.  Skin: Negative for rash.  Neurological: Negative for dizziness, light-headedness and headaches.     Physical Exam Triage Vital Signs ED Triage Vitals  Enc Vitals Group     BP 12/24/18 1528 123/89     Pulse Rate 12/24/18 1528 86     Resp 12/24/18 1528 16     Temp 12/24/18 1528 97.8 F (36.6 C)     Temp Source 12/24/18 1528 Oral     SpO2 12/24/18 1528 97 %     Weight 12/24/18 1545 213 lb (96.6 kg)     Height --      Head Circumference --      Peak Flow --      Pain Score 12/24/18 1545 2     Pain Loc --      Pain Edu? --      Excl. in GC? --     No data found.  Updated Vital Signs BP 123/89 (BP Location: Left Arm)   Pulse 86   Temp 97.8 F (36.6 C) (Oral)   Resp 16   Wt 213 lb (96.6 kg)   SpO2 97%   BMI 33.36 kg/m   Visual Acuity Right Eye Distance:   Left Eye Distance:   Bilateral Distance:    Right Eye Near:  20/40 Left Eye Near:   20/40 Bilateral Near:   20/30  Physical Exam Vitals signs and nursing note reviewed.  Constitutional:      Appearance: He is well-developed.  HENT:     Head: Normocephalic and atraumatic.     Ears:     Comments: Bilateral ears without tenderness to palpation of external auricle, tragus and mastoid, EAC's without erythema or swelling, TM's with good bony landmarks and cone of light. Non erythematous.     Mouth/Throat:     Comments: Oral mucosa pink and moist, no tonsillar enlargement or exudate. Posterior pharynx patent and nonerythematous, no uvula deviation or swelling. Normal phonation. Eyes:     Extraocular Movements: Extraocular movements intact.     Conjunctiva/sclera: Conjunctivae normal.     Pupils: Pupils are equal, round, and reactive to light.     Comments: Left eye: No conjunctival erythema, no obvious drainage noted, no fluorescein uptake on exam  Neck:     Musculoskeletal: Neck supple.  Cardiovascular:     Rate and Rhythm: Normal rate and regular rhythm.     Heart sounds: No murmur.  Pulmonary:     Effort: Pulmonary effort is normal. No respiratory distress.     Breath sounds: Normal breath sounds.  Abdominal:     Palpations: Abdomen is soft.     Tenderness: There is no abdominal tenderness.  Skin:    General: Skin is warm and dry.  Neurological:     Mental Status: He is alert.      UC Treatments / Results  Labs (all labs ordered are listed, but only abnormal results are displayed) Labs Reviewed - No data to display  EKG None  Radiology No results found.  Procedures Procedures (including critical care time)  Medications Ordered in UC  Medications - No data to display  Initial Impression / Assessment and Plan / UC Course  I have reviewed the triage vital signs and the nursing notes.  Pertinent labs & imaging results that were available during my care of the patient were reviewed by me and considered in my medical decision making (see chart for details).     Eye exam not suggestive of bacterial conjunctivitis, possible viral versus allergic conjunctivitis.  Will recommend  symptomatic and supportive care.  Olopatadine as needed for itching and irritation twice daily.  Warm compresses, good hand hygiene.  Visual acuity intact, no sign of abrasion or ulcer.  Continue to monitor,Discussed strict return precautions. Patient verbalized understanding and is agreeable with plan.  Final Clinical Impressions(s) / UC Diagnoses   Final diagnoses:  Allergic conjunctivitis of left eye     Discharge Instructions     Please use olopatadine eyedrops twice daily to help with itching and irritation Avoid touching eye May apply warm compresses Please follow-up if developing eye pain, increased redness, drainage or changes in vision, swelling around eye   ED Prescriptions    Medication Sig Dispense Auth. Provider   olopatadine (PATANOL) 0.1 % ophthalmic solution Place 1 drop into both eyes 2 (two) times daily. 5 mL Wieters, Hallie C, PA-C     Controlled Substance Prescriptions [place] Controlled Substance Registry consulted? Not Applicable   Lew Dawes, [place] 12/24/18 1603

## 2018-12-24 NOTE — ED Triage Notes (Signed)
Pt cc he has a pink eye ( left eye ) pt states he needs a note for work.

## 2019-02-01 ENCOUNTER — Telehealth: Payer: Self-pay | Admitting: *Deleted

## 2019-02-01 ENCOUNTER — Encounter (HOSPITAL_COMMUNITY): Payer: Self-pay

## 2019-02-01 ENCOUNTER — Other Ambulatory Visit: Payer: Self-pay

## 2019-02-01 ENCOUNTER — Ambulatory Visit (HOSPITAL_COMMUNITY)
Admission: EM | Admit: 2019-02-01 | Discharge: 2019-02-01 | Disposition: A | Discharge status: Home / Self Care | Payer: Self-pay

## 2019-02-01 DIAGNOSIS — R6889 Other general symptoms and signs: Secondary | ICD-10-CM

## 2019-02-01 DIAGNOSIS — Z20822 Contact with and (suspected) exposure to covid-19: Secondary | ICD-10-CM

## 2019-02-01 DIAGNOSIS — K529 Noninfective gastroenteritis and colitis, unspecified: Secondary | ICD-10-CM

## 2019-02-01 DIAGNOSIS — R110 Nausea: Secondary | ICD-10-CM

## 2019-02-01 DIAGNOSIS — R0602 Shortness of breath: Secondary | ICD-10-CM

## 2019-02-01 DIAGNOSIS — R05 Cough: Secondary | ICD-10-CM

## 2019-02-01 MED ORDER — ALBUTEROL SULFATE HFA 108 (90 BASE) MCG/ACT IN AERS: 2.0000 | INHALATION_SPRAY | Freq: Four times a day (QID) | RESPIRATORY_TRACT | 0 refills | Status: DC | PRN

## 2019-02-01 MED ORDER — ONDANSETRON HCL 4 MG PO TABS: 4.0000 mg | ORAL_TABLET | Freq: Three times a day (TID) | ORAL | 0 refills | Status: AC | PRN

## 2019-02-01 NOTE — Telephone Encounter (Signed)
Pt scheduled for covid testing today @ 10:45 @ [facility]. Instructions given and order placed.

## 2019-02-01 NOTE — Telephone Encounter (Signed)
-----   Message from [place], [place] sent at 02/01/2019  9:24 AM EDT ----- Regarding: needs COVID 4 days GI sx w/ cough & SOB. Works in [employer].

## 2019-02-01 NOTE — ED Provider Notes (Signed)
[facility]    CSN: [id number] Arrival date & time: 02/01/19  0813     History   Chief Complaint Chief Complaint  Patient presents with  . Generalized Body Aches  . Cough  . Diarrhea    HPI Allen PlanVictor Semmel Jr. is a 33 y.o. male any for 4-day history of myalgia, cough, diarrhea.  Patient denies known sick contacts, contact with COVID positive persons.  Patient states that he woke up 4 days ago with symptoms.  Has had some increased night sweats.  Reports good appetite, understands some nausea.  States that he vomited yesterday without bile or blood.  Patient previously had an albuterol inhaler, but has run out of this refill.  Has had productive, non-hemoptic cough.  States that his phlegm is clear, thin.  Patient denies audible wheezing, though has had increased dyspnea.  Patient has had a few episodes of watery diarrhea without blood or melena or mucus.  She has not taken anything for symptoms.  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   Past Medical History:  Diagnosis Date  . Fracture of metacarpal base of right hand, closed 11/2015   right small finger  . GERD (gastroesophageal reflux disease)    no current med.  Marland Kitchen. HIV (human immunodeficiency virus infection) (HCC)     There are no active problems to display for this patient.   Past Surgical History:  Procedure Laterality Date  . NO PAST SURGERIES    . OPEN REDUCTION INTERNAL FIXATION (ORIF) METACARPAL Right 12/09/2015   Procedure: OPEN REDUCTION INTERNAL FIXATION (ORIF) RIGHT SMALL FINGER/METACARPAL;  Surgeon: Betha LoaKevin Kuzma, MD;  Location: [facility];  Service: Orthopedics;  Laterality: Right;       Home Medications    Prior to Admission medications   Medication Sig Start Date End Date Taking? Authorizing Provider  hydrOXYzine (ATARAX/VISTARIL) 10 MG tablet Take 10 mg by mouth 3 (three) times daily as needed.   Yes  [provider]  albuterol (VENTOLIN HFA) 108 (90 Base) MCG/ACT inhaler Inhale 2 puffs into the lungs every 6 (six) hours as needed for wheezing or shortness of breath. 02/01/19   Hall-Potvin, [place], PA-C  BIKTARVY 50-200-25 MG TABS tablet TK 1 T PO QD 12/29/18   [provider]  ibuprofen (ADVIL,MOTRIN) 800 MG tablet Take 1 tablet (800 mg total) by mouth 3 (three) times daily. 12/01/15   Ward, Chase PicketJaime Pilcher, PA-C  lisinopril (ZESTRIL) 5 MG tablet TK 1 T PO D 12/29/18   [provider]  ondansetron (ZOFRAN) 4 MG tablet Take 1 tablet (4 mg total) by mouth every 8 (eight) hours as needed for up to 7 days for nausea or vomiting. 02/01/19 02/08/19  Hall-Potvin, [place], PA-C    Family History Family History  Problem Relation Age of Onset  . Cancer Mother     Social History Social History   Tobacco Use  . Smoking status: Current Every Day Smoker    Packs/day: 0.50    Years: 10.00    Pack years: 5.00    Types: Cigarettes  . Smokeless tobacco: Never Used  Substance Use Topics  . Alcohol use: Yes    Comment: socially  . Drug use: No     Allergies   Patient has no known allergies.   Review of Systems As per HPI   Physical Exam Triage Vital Signs ED Triage Vitals  Enc Vitals Group     BP  02/01/19 0853 (!) 128/94     Pulse Rate 02/01/19 0853 88     Resp 02/01/19 0853 17     Temp 02/01/19 0853 98.4 F (36.9 C)     Temp Source 02/01/19 0853 Oral     SpO2 02/01/19 0853 100 %     Weight --      Height --      Head Circumference --      Peak Flow --      Pain Score 02/01/19 0848 7     Pain Loc --      Pain Edu? --      Excl. in GC? --    No data found.  Updated Vital Signs BP (!) 128/94 (BP Location: Left Arm)   Pulse 88   Temp 98.4 F (36.9 C) (Oral)   Resp 17   SpO2 100%   Visual Acuity Right Eye Distance:   Left Eye Distance:   Bilateral Distance:    Right Eye Near:   Left Eye Near:    Bilateral Near:     Physical Exam  Constitutional:      General: He is not in acute distress.    Appearance: He is not ill-appearing.  HENT:     Head: Normocephalic and atraumatic.     Nose: Nose normal.     Mouth/Throat:     Mouth: Mucous membranes are moist.  Eyes:     General: No scleral icterus.       Right eye: No discharge.        Left eye: No discharge.     Conjunctiva/sclera: Conjunctivae normal.     Pupils: Pupils are equal, round, and reactive to light.  Cardiovascular:     Rate and Rhythm: Normal rate.  Pulmonary:     Effort: Pulmonary effort is normal. No respiratory distress.     Breath sounds: No wheezing.  Skin:    Coloration: Skin is not jaundiced or pale.  Neurological:     General: No focal deficit present.     Mental Status: He is alert and oriented to person, place, and time.  Psychiatric:        Mood and Affect: Mood normal.        Thought Content: Thought content normal.      UC Treatments / Results  Labs (all labs ordered are listed, but only abnormal results are displayed) Labs Reviewed - No data to display  EKG None  Radiology No results found.  Procedures Procedures (including critical care time)  Medications Ordered in UC Medications - No data to display  Initial Impression / Assessment and Plan / UC Course  I have reviewed the triage vital signs and the nursing notes.  Pertinent labs & imaging results that were available during my care of the patient were reviewed by me and considered in my medical decision making (see chart for details).     33 year old male with 4-day course of nausea, single episode of emesis, intermittent diarrhea.  Vitals reassuring, patient overall appears well.  Given symptomatology, and potential exposure as he works in [employer], will pursue COVID testing.  Patient to self isolate until results are back.  Patient verbalized understanding.  Refill sent for his albuterol inhaler, Zofran should his nausea return. Final Clinical Impressions(s) / UC  Diagnoses   Final diagnoses:  Gastroenteritis  Nausea  Suspected Covid-19 Virus Infection     Discharge Instructions     You must stay home until your COVID results are  back. Important to stay hydrated. Return if symptoms worsen or you develop SOB.    ED Prescriptions    Medication Sig Dispense Auth. Provider   albuterol (VENTOLIN HFA) 108 (90 Base) MCG/ACT inhaler Inhale 2 puffs into the lungs every 6 (six) hours as needed for wheezing or shortness of breath. 18 g Hall-Potvin, [place], PA-C   ondansetron (ZOFRAN) 4 MG tablet Take 1 tablet (4 mg total) by mouth every 8 (eight) hours as needed for up to 7 days for nausea or vomiting. 21 tablet Hall-Potvin, [place], PA-C     Controlled Substance Prescriptions [place] Controlled Substance Registry consulted? Not Applicable   Quincy Sheehan, [place] 02/01/19 1655

## 2019-02-01 NOTE — Discharge Instructions (Addendum)
You must stay home until your COVID results are back. Important to stay hydrated. Return if symptoms worsen or you develop SOB.

## 2019-02-01 NOTE — ED Triage Notes (Signed)
Patient presents to Urgent Care with complaints of generalized body aches, cough, and intermittent diarrhea since 4 days ago. Patient reports he needs a note to return to work.

## 2019-02-06 ENCOUNTER — Ambulatory Visit: Payer: Self-pay

## 2019-02-06 ENCOUNTER — Other Ambulatory Visit: Payer: Self-pay

## 2019-02-06 LAB — NOVEL CORONAVIRUS, NAA: SARS-CoV-2, NAA: NOT DETECTED

## 2019-02-22 ENCOUNTER — Encounter: Payer: Self-pay | Admitting: Family

## 2019-02-22 ENCOUNTER — Ambulatory Visit: Payer: Self-pay | Admitting: Pharmacist

## 2019-02-23 ENCOUNTER — Other Ambulatory Visit: Payer: Self-pay | Admitting: *Deleted

## 2019-02-23 DIAGNOSIS — B20 Human immunodeficiency virus [HIV] disease: Secondary | ICD-10-CM

## 2019-02-23 DIAGNOSIS — Z113 Encounter for screening for infections with a predominantly sexual mode of transmission: Secondary | ICD-10-CM

## 2019-02-27 ENCOUNTER — Other Ambulatory Visit: Payer: Self-pay

## 2019-02-27 ENCOUNTER — Ambulatory Visit: Payer: Self-pay

## 2019-03-01 ENCOUNTER — Other Ambulatory Visit: Payer: Self-pay

## 2019-03-01 ENCOUNTER — Ambulatory Visit: Payer: Self-pay

## 2019-03-01 ENCOUNTER — Encounter: Payer: Self-pay | Admitting: Family

## 2019-03-01 DIAGNOSIS — Z113 Encounter for screening for infections with a predominantly sexual mode of transmission: Secondary | ICD-10-CM

## 2019-03-01 DIAGNOSIS — B20 Human immunodeficiency virus [HIV] disease: Secondary | ICD-10-CM

## 2019-03-02 LAB — T-HELPER CELL (CD4) - (RCID CLINIC ONLY)
CD4 % Helper T Cell: 52 % (ref 33–65)
CD4 T Cell Abs: 838 /uL (ref 400–1790)

## 2019-03-02 LAB — URINALYSIS
Bilirubin Urine: NEGATIVE
Glucose, UA: NEGATIVE
Hgb urine dipstick: NEGATIVE
Ketones, ur: NEGATIVE
Leukocytes,Ua: NEGATIVE
Nitrite: NEGATIVE
Protein, ur: NEGATIVE
Specific Gravity, Urine: 1.024 (ref 1.001–1.03)
pH: <=5 (ref 5.0–8.0)

## 2019-03-02 LAB — URINE CYTOLOGY ANCILLARY ONLY
Chlamydia: NEGATIVE
Neisseria Gonorrhea: NEGATIVE

## 2019-03-13 ENCOUNTER — Encounter: Payer: Self-pay | Admitting: Family

## 2019-03-15 LAB — COMPLETE METABOLIC PANEL WITH GFR
AG Ratio: 1.9 (calc) (ref 1.0–2.5)
ALT: 22 U/L (ref 9–46)
AST: 20 U/L (ref 10–40)
Albumin: 4.5 g/dL (ref 3.6–5.1)
Alkaline phosphatase (APISO): 88 U/L (ref 36–130)
BUN: 14 mg/dL (ref 7–25)
CO2: 22 mmol/L (ref 20–32)
Calcium: 9.5 mg/dL (ref 8.6–10.3)
Chloride: 107 mmol/L (ref 98–110)
Creat: 1.12 mg/dL (ref 0.60–1.35)
GFR, Est African American: 100 mL/min/{1.73_m2} (ref >=60)
GFR, Est Non African American: 86 mL/min/{1.73_m2} (ref >=60)
Globulin: 2.4 g/dL (calc) (ref 1.9–3.7)
Glucose, Bld: 100 mg/dL — ABNORMAL HIGH (ref 65–99)
Potassium: 4.3 mmol/L (ref 3.5–5.3)
Sodium: 138 mmol/L (ref 135–146)
Total Bilirubin: 0.3 mg/dL (ref 0.2–1.2)
Total Protein: 6.9 g/dL (ref 6.1–8.1)

## 2019-03-15 LAB — CBC WITH DIFFERENTIAL/PLATELET
Absolute Monocytes: 428 cells/uL (ref 200–950)
Basophils Absolute: 31 cells/uL (ref 0–200)
Basophils Relative: 0.6 %
Eosinophils Absolute: 138 cells/uL (ref 15–500)
Eosinophils Relative: 2.7 %
HCT: 40.9 % (ref 38.5–50.0)
Hemoglobin: 13.6 g/dL (ref 13.2–17.1)
Lymphs Abs: 1632 cells/uL (ref 850–3900)
MCH: 27.1 pg (ref 27.0–33.0)
MCHC: 33.3 g/dL (ref 32.0–36.0)
MCV: 81.5 fL (ref 80.0–100.0)
MPV: 10.5 fL (ref 7.5–12.5)
Monocytes Relative: 8.4 %
Neutro Abs: 2871 cells/uL (ref 1500–7800)
Neutrophils Relative %: 56.3 %
Platelets: 299 10*3/uL (ref 140–400)
RBC: 5.02 10*6/uL (ref 4.20–5.80)
RDW: 14.2 % (ref 11.0–15.0)
Total Lymphocyte: 32 %
WBC: 5.1 10*3/uL (ref 3.8–10.8)

## 2019-03-15 LAB — HIV ANTIBODY (ROUTINE TESTING W REFLEX): HIV 1&2 Ab, 4th Generation: REACTIVE — AB

## 2019-03-15 LAB — HIV-1 RNA ULTRAQUANT REFLEX TO GENTYP+
HIV 1 RNA Quant: 24 copies/mL — ABNORMAL HIGH
HIV-1 RNA Quant, Log: 1.38 Log copies/mL — ABNORMAL HIGH

## 2019-03-15 LAB — RPR TITER: RPR Titer: 1:1 {titer} — ABNORMAL HIGH

## 2019-03-15 LAB — QUANTIFERON-TB GOLD PLUS
Mitogen-NIL: >10 IU/mL
NIL: 0.03 IU/mL
QuantiFERON-TB Gold Plus: NEGATIVE
TB1-NIL: <0 IU/mL
TB2-NIL: <0 IU/mL

## 2019-03-15 LAB — LIPID PANEL
Cholesterol: 171 mg/dL (ref <200)
HDL: 39 mg/dL — ABNORMAL LOW (ref >=40)
LDL Cholesterol (Calc): 91 mg/dL (calc)
Non-HDL Cholesterol (Calc): 132 mg/dL (calc) — ABNORMAL HIGH (ref <130)
Total CHOL/HDL Ratio: 4.4 (calc) (ref <5.0)
Triglycerides: 284 mg/dL — ABNORMAL HIGH (ref <150)

## 2019-03-15 LAB — RPR: RPR Ser Ql: REACTIVE — AB

## 2019-03-15 LAB — HIV-1/2 AB - DIFFERENTIATION
HIV-1 antibody: POSITIVE — AB
HIV-2 Ab: NEGATIVE

## 2019-03-15 LAB — HEPATITIS A ANTIBODY, TOTAL: Hepatitis A AB,Total: NONREACTIVE

## 2019-03-15 LAB — FLUORESCENT TREPONEMAL AB(FTA)-IGG-BLD: Fluorescent Treponemal ABS: REACTIVE — AB

## 2019-03-15 LAB — HEPATITIS B SURFACE ANTIBODY,QUALITATIVE: Hep B S Ab: REACTIVE — AB

## 2019-03-15 LAB — HEPATITIS C ANTIBODY
Hepatitis C Ab: NONREACTIVE
SIGNAL TO CUT-OFF: 0.01 (ref <1.00)

## 2019-03-15 LAB — HEPATITIS B SURFACE ANTIGEN: Hepatitis B Surface Ag: NONREACTIVE

## 2019-03-15 LAB — HLA B*5701: HLA-B*5701 w/rflx HLA-B High: NEGATIVE

## 2019-03-15 LAB — HEPATITIS B CORE ANTIBODY, TOTAL: Hep B Core Total Ab: NONREACTIVE

## 2019-03-19 ENCOUNTER — Telehealth: Payer: Self-pay | Admitting: Family

## 2019-03-19 NOTE — Telephone Encounter (Signed)
COVID-19 Pre-Screening Questions: ° °Do you currently have a fever (>100 °F), chills or unexplained body aches? N ° °Are you currently experiencing new cough, shortness of breath, sore throat, runny nose? N °•  °Have you recently travelled outside the state of [place] in the last 14 days? N  °•  °Have you been in contact with someone that is currently pending confirmation of Covid19 testing or has been confirmed to have the Covid19 virus?  N ° °**If the patient answers NO to ALL questions -  advise the patient to please call the clinic before coming to the office should any symptoms develop.  ° ° ° °

## 2019-03-20 ENCOUNTER — Ambulatory Visit (INDEPENDENT_AMBULATORY_CARE_PROVIDER_SITE_OTHER): Payer: Self-pay | Admitting: Family

## 2019-03-20 ENCOUNTER — Other Ambulatory Visit: Payer: Self-pay

## 2019-03-20 ENCOUNTER — Ambulatory Visit (INDEPENDENT_AMBULATORY_CARE_PROVIDER_SITE_OTHER): Payer: Self-pay | Admitting: Pharmacist

## 2019-03-20 ENCOUNTER — Encounter: Payer: Self-pay | Admitting: Family

## 2019-03-20 VITALS — BP 141/100 | HR 101 | Temp 98.3°F | Wt 223.0 lb

## 2019-03-20 DIAGNOSIS — B20 Human immunodeficiency virus [HIV] disease: Secondary | ICD-10-CM

## 2019-03-20 DIAGNOSIS — I10 Essential (primary) hypertension: Secondary | ICD-10-CM

## 2019-03-20 DIAGNOSIS — F419 Anxiety disorder, unspecified: Secondary | ICD-10-CM

## 2019-03-20 DIAGNOSIS — Z113 Encounter for screening for infections with a predominantly sexual mode of transmission: Secondary | ICD-10-CM

## 2019-03-20 DIAGNOSIS — Z0000 Encounter for general adult medical examination without abnormal findings: Secondary | ICD-10-CM

## 2019-03-20 DIAGNOSIS — J4520 Mild intermittent asthma, uncomplicated: Secondary | ICD-10-CM

## 2019-03-20 MED ORDER — LISINOPRIL 10 MG PO TABS: ORAL_TABLET | ORAL | 2 refills | Status: DC

## 2019-03-20 MED ORDER — HYDROXYZINE HCL 10 MG PO TABS: 10.0000 mg | ORAL_TABLET | Freq: Three times a day (TID) | ORAL | 2 refills | Status: DC | PRN

## 2019-03-20 MED ORDER — IBUPROFEN 800 MG PO TABS: 800.0000 mg | ORAL_TABLET | Freq: Three times a day (TID) | ORAL | 0 refills | Status: DC

## 2019-03-20 MED ORDER — ALBUTEROL SULFATE HFA 108 (90 BASE) MCG/ACT IN AERS: 2.0000 | INHALATION_SPRAY | Freq: Four times a day (QID) | RESPIRATORY_TRACT | 0 refills | Status: DC | PRN

## 2019-03-20 MED ORDER — BIKTARVY 50-200-25 MG PO TABS: ORAL_TABLET | ORAL | 4 refills | Status: DC

## 2019-03-20 NOTE — Progress Notes (Signed)
Subjective:    Patient ID: Allen Noblet., male    DOB: Jul 31, 1986, 33 y.o.   MRN: [id number]  Chief Complaint  Patient presents with   HIV Positive/AIDS    HPI:  Allen Ion. is a 33 y.o. male with previous medical history of hypertension, HIV disease, gastroesophageal reflux, and anxiety presenting today to transfer care for HIV disease from the [place], [place] area.  Initial clinic blood work completed on 03/01/2019 with viral load that is undetectable and CD4 count of 838.  RPR was reactive with 1: 1 having previously been treated at 1: 8.  He is serologically immune to hepatitis B with no active infection with hepatitis B or C. HLA-[id number] and QuantiFERON gold negative.  Kidney function, liver function, electrolytes within normal ranges.  Lipid profile with LDL of 91, HDL 39, and triglycerides 284.  Allen [name] was initially diagnosed in 2018 and believes he acquired HIV from receiving a tattoo. He was initially started on he was initially started on Prezcobix and Descovy following diagnosis, however experienced nausea, vomiting and diarrhea after starting the medication. He was then switched to [employer].   Allen [name] continues to take his Biktarvy as prescribed with no adverse side effects and recently missed 2 days of medication.  He does have cold sweats on occasion otherwise feeling well with no other concerns. Denies fevers, chills, night sweats, headaches, changes in vision, neck pain/stiffness, nausea, diarrhea, vomiting, lesions or rashes.  Allen [name] has been approved for UMAP/ADAP and has no problems obtaining his medication from the pharmacy which is currently mailed to him.  Denies feelings of being down, depressed, or hopeless recently.  He is currently on unemployment.  On average he uses marijuana on a daily basis and smokes approximately 1/2 pack of cigarettes per day.  Alcohol use is also on occasion.  He does remain sexually active and uses  condoms.  Per chart review he does have a significant mental health history and been on multiple medications in the past.   No Known Allergies    Outpatient Medications Prior to Visit  Medication Sig Dispense Refill   albuterol (VENTOLIN HFA) 108 (90 Base) MCG/ACT inhaler Inhale 2 puffs into the lungs every 6 (six) hours as needed for wheezing or shortness of breath. 18 g 0   BIKTARVY 50-200-25 MG TABS tablet TK 1 T PO QD     hydrOXYzine (ATARAX/VISTARIL) 10 MG tablet Take 10 mg by mouth 3 (three) times daily as needed.     ibuprofen (ADVIL,MOTRIN) 800 MG tablet Take 1 tablet (800 mg total) by mouth 3 (three) times daily. 21 tablet 0   lisinopril (ZESTRIL) 5 MG tablet TK 1 T PO D     No facility-administered medications prior to visit.      Past Medical History:  Diagnosis Date   Anxiety    Fracture of metacarpal base of right hand, closed 11/2015   right small finger   GERD (gastroesophageal reflux disease)    no current med.   HIV (human immunodeficiency virus infection) ([place])    Hypertension       Past Surgical History:  Procedure Laterality Date   NO PAST SURGERIES     OPEN REDUCTION INTERNAL FIXATION (ORIF) METACARPAL Right 12/09/2015   Procedure: OPEN REDUCTION INTERNAL FIXATION (ORIF) RIGHT SMALL FINGER/METACARPAL;  Surgeon: Leanora Cover, MD;  Location: [facility];  Service: Orthopedics;  Laterality: Right;      Family History  Problem Relation Age of Onset  Cancer Mother       Social History   Socioeconomic History   Marital status: Single    Spouse name: Not on file   Number of children: Not on file   Years of education: Not on file   Highest education level: Not on file  Occupational History   Not on file  Social Needs   Financial resource strain: Not on file   Food insecurity    Worry: Not on file    Inability: Not on file   Transportation needs    Medical: Not on file    Non-medical: Not on file  Tobacco  Use   Smoking status: Current Every Day Smoker    Packs/day: 0.50    Years: 10.00    Pack years: 5.00    Types: Cigarettes   Smokeless tobacco: Never Used  Substance and Sexual Activity   Alcohol use: Yes    Comment: socially   Drug use: Yes    Types: Marijuana   Sexual activity: Not on file  Lifestyle   Physical activity    Days per week: Not on file    Minutes per session: Not on file   Stress: Not on file  Relationships   Social connections    Talks on phone: Not on file    Gets together: Not on file    Attends religious service: Not on file    Active member of club or organization: Not on file    Attends meetings of clubs or organizations: Not on file    Relationship status: Not on file   Intimate partner violence    Fear of current or ex partner: Not on file    Emotionally abused: Not on file    Physically abused: Not on file    Forced sexual activity: Not on file  Other Topics Concern   Not on file  Social History Narrative   Not on file    Review of Systems  Constitutional: Negative for appetite change, chills, fatigue, fever and unexpected weight change.  Eyes: Negative for visual disturbance.  Respiratory: Negative for cough, chest tightness, shortness of breath and wheezing.   Cardiovascular: Negative for chest pain and leg swelling.  Gastrointestinal: Negative for abdominal pain, constipation, diarrhea, nausea and vomiting.  Genitourinary: Negative for dysuria, flank pain, frequency, genital sores, hematuria and urgency.  Skin: Negative for rash.  Allergic/Immunologic: Negative for immunocompromised state.  Neurological: Negative for dizziness and headaches.       Objective:    BP (!) 141/100    Pulse (!) 101    Temp 98.3 F (36.8 C) (Oral)    Wt 223 lb (101.2 kg)    BMI 34.93 kg/m  Nursing note and vital signs reviewed.  Physical Exam Constitutional:      General: He is not in acute distress.    Appearance: He is well-developed.    Eyes:     Conjunctiva/sclera: Conjunctivae normal.  Neck:     Musculoskeletal: Neck supple.  Cardiovascular:     Rate and Rhythm: Normal rate and regular rhythm.     Heart sounds: Normal heart sounds. No murmur. No friction rub. No gallop.   Pulmonary:     Effort: Pulmonary effort is normal. No respiratory distress.     Breath sounds: Normal breath sounds. No wheezing or rales.  Chest:     Chest wall: No tenderness.  Abdominal:     General: Bowel sounds are normal.     Palpations: Abdomen is soft.  Tenderness: There is no abdominal tenderness.  Lymphadenopathy:     Cervical: No cervical adenopathy.  Skin:    General: Skin is warm and dry.     Findings: No rash.  Neurological:     Mental Status: He is alert and oriented to person, place, and time.  Psychiatric:        Behavior: Behavior normal.        Thought Content: Thought content normal.        Judgment: Judgment normal.        Assessment & Plan:   Problem List Items Addressed This Visit      Cardiovascular and Mediastinum   Essential hypertension    Blood pressure elevated above goal 140/90 today.  Allen [name] says this is because he smoked a cigarette prior to entering the office although I think it is likely he has had poorly controlled hypertension recently.  Fortunately he has no signs/symptoms of headache/blurred vision or other red flag warning symptoms.  Discussed importance of controlling blood pressure to prevent complication in the future including heart failure.  Increase lisinopril to 10 mg daily.  Encouraged to monitor blood pressure at home as able.      Relevant Medications   lisinopril (ZESTRIL) 10 MG tablet     Respiratory   Mild intermittent asthma without complication    Allen [name] has adequately controlled mild intermittent asthma with albuterol as needed.  Refill albuterol.  Good Rx coupon provided to assist with medication payment.      Relevant Medications   albuterol (VENTOLIN HFA)  108 (90 Base) MCG/ACT inhaler     Other   Human immunodeficiency virus (HIV) disease ([place]) - Primary     Allen [name] appears to have well-controlled HIV disease with good adherence and tolerance to his ART regimen of Biktarvy.  No signs/symptoms of opportunistic infection or progressive HIV disease at present.  Clinic orientation provided.  Financial assistance renewed and up-to-date.  Continue current dose of Biktarvy.  Plan for follow-up office visit in 3 months or sooner if needed with lab work 1 to 2 weeks prior to appointment.      Relevant Medications   BIKTARVY 50-200-25 MG TABS tablet   Other Relevant Orders   T-helper cell (CD4)- ([facility] clinic only)   HIV-1 RNA quant-no reflex-bld   Comprehensive metabolic panel   Healthcare maintenance     Discussed importance of safe sexual practice to reduce risk of acquisition/transmission of STI.  Awaiting dental referral to receive dental exam for routine maintenance.  Recommend influenza vaccination when available in September.      Anxiety    Allen [name] has occasional anxiety which is adequately controlled with hydroxyzine as needed.  Will refill prescription.  Encouraged to seek counseling if symptoms worsen or finding that he needs additional hydroxyzine.      Relevant Medications   hydrOXYzine (ATARAX/VISTARIL) 10 MG tablet    Other Visit Diagnoses    Screening for STDs (sexually transmitted diseases)       Relevant Orders   RPR       I have changed Darrol Poke Jr.'s ibuprofen, hydrOXYzine, and lisinopril. I am also having him maintain his albuterol and Biktarvy.   Meds ordered this encounter  Medications   ibuprofen (ADVIL) 800 MG tablet    Sig: Take 1 tablet (800 mg total) by mouth 3 (three) times daily.    Dispense:  21 tablet    Refill:  0    Order Specific Question:  Supervising Provider    Answer:   Carlyle Basques [[id number]]   albuterol (VENTOLIN HFA) 108 (90 Base) MCG/ACT inhaler    Sig: Inhale 2  puffs into the lungs every 6 (six) hours as needed for wheezing or shortness of breath.    Dispense:  18 g    Refill:  0    Order Specific Question:   Supervising Provider    Answer:   SNIDER, CYNTHIA [[id number]]   BIKTARVY 50-200-25 MG TABS tablet    Sig: TK 1 T PO QD    Dispense:  30 tablet    Refill:  4    Order Specific Question:   Supervising Provider    Answer:   Baxter Flattery, CYNTHIA [[id number]]   hydrOXYzine (ATARAX/VISTARIL) 10 MG tablet    Sig: Take 1 tablet (10 mg total) by mouth 3 (three) times daily as needed.    Dispense:  30 tablet    Refill:  2    Order Specific Question:   Supervising Provider    Answer:   Baxter Flattery, CYNTHIA [[id number]]   lisinopril (ZESTRIL) 10 MG tablet    Sig: TK 1 T PO D    Dispense:  30 tablet    Refill:  2    Order Specific Question:   Supervising Provider    Answer:   Carlyle Basques [[id number]]     Follow-up: Return in about 3 weeks (around 04/10/2019), or if symptoms worsen or fail to improve.    Terri Piedra, MSN, FNP-C Nurse Practitioner [facility] Office phone: [phone] Pager: [place] number: [phone]

## 2019-03-20 NOTE — Assessment & Plan Note (Signed)
Allen [name] has adequately controlled mild intermittent asthma with albuterol as needed.  Refill albuterol.  Good Rx coupon provided to assist with medication payment.

## 2019-03-20 NOTE — Assessment & Plan Note (Signed)
Mr. Allen [name] has occasional anxiety which is adequately controlled with hydroxyzine as needed.  Will refill prescription.  Encouraged to seek counseling if symptoms worsen or finding that he needs additional hydroxyzine.

## 2019-03-20 NOTE — Assessment & Plan Note (Signed)
   Discussed importance of safe sexual practice to reduce risk of acquisition/transmission of STI.  Awaiting dental referral to receive dental exam for routine maintenance.  Recommend influenza vaccination when available in September.

## 2019-03-20 NOTE — Assessment & Plan Note (Signed)
Blood pressure elevated above goal 140/90 today.  Allen [name] says this is because he smoked a cigarette prior to entering the office although I think it is likely he has had poorly controlled hypertension recently.  Fortunately he has no signs/symptoms of headache/blurred vision or other red flag warning symptoms.  Discussed importance of controlling blood pressure to prevent complication in the future including heart failure.  Increase lisinopril to 10 mg daily.  Encouraged to monitor blood pressure at home as able.

## 2019-03-20 NOTE — Assessment & Plan Note (Signed)
   Allen [name] appears to have well-controlled HIV disease with good adherence and tolerance to his ART regimen of Biktarvy.  No signs/symptoms of opportunistic infection or progressive HIV disease at present.  Clinic orientation provided.  Financial assistance renewed and up-to-date.  Continue current dose of Biktarvy.  Plan for follow-up office visit in 3 months or sooner if needed with lab work 1 to 2 weeks prior to appointment.

## 2019-03-20 NOTE — Patient Instructions (Signed)
Nice to meet you!  We will continue with your Biktarvy daily.  Refills of your medication have been sent to the pharmacy.  We will get you established to see the dental clinic.   Plan for follow up in 3 months or sooner if needed with lab work 1-2 weeks prior to your appointment.  Have a great day and stay safe!

## 2019-03-22 NOTE — Progress Notes (Signed)
Patient left before I could meet with him.  Will see him when he follows up in November. Per Marya Amsler, patient doing well with no questions for me.

## 2019-04-17 ENCOUNTER — Encounter: Payer: Self-pay | Admitting: Family

## 2019-06-20 ENCOUNTER — Other Ambulatory Visit: Payer: Self-pay

## 2019-06-29 ENCOUNTER — Telehealth: Payer: Self-pay | Admitting: *Deleted

## 2019-06-29 NOTE — Telephone Encounter (Signed)
Patient called to advise he has moved to [place] and has signed a release there to have records sent.

## 2019-07-04 ENCOUNTER — Encounter: Payer: Self-pay | Admitting: Family

## 2019-10-08 ENCOUNTER — Ambulatory Visit: Payer: Self-pay

## 2019-10-08 ENCOUNTER — Other Ambulatory Visit: Payer: Self-pay

## 2019-10-09 ENCOUNTER — Ambulatory Visit: Payer: Self-pay

## 2019-10-09 ENCOUNTER — Other Ambulatory Visit: Payer: Self-pay

## 2019-10-09 DIAGNOSIS — B20 Human immunodeficiency virus [HIV] disease: Secondary | ICD-10-CM

## 2019-10-09 DIAGNOSIS — Z113 Encounter for screening for infections with a predominantly sexual mode of transmission: Secondary | ICD-10-CM

## 2019-10-10 ENCOUNTER — Encounter: Payer: Self-pay | Admitting: Family

## 2019-10-10 LAB — T-HELPER CELL (CD4) - (RCID CLINIC ONLY)
CD4 % Helper T Cell: 56 % (ref 33–65)
CD4 T Cell Abs: 1234 /uL (ref 400–1790)

## 2019-10-12 LAB — COMPREHENSIVE METABOLIC PANEL
AG Ratio: 2 (calc) (ref 1.0–2.5)
ALT: 13 U/L (ref 9–46)
AST: 14 U/L (ref 10–40)
Albumin: 4.7 g/dL (ref 3.6–5.1)
Alkaline phosphatase (APISO): 66 U/L (ref 36–130)
BUN: 8 mg/dL (ref 7–25)
CO2: 27 mmol/L (ref 20–32)
Calcium: 10 mg/dL (ref 8.6–10.3)
Chloride: 102 mmol/L (ref 98–110)
Creat: 1.19 mg/dL (ref 0.60–1.35)
Globulin: 2.4 g/dL (calc) (ref 1.9–3.7)
Glucose, Bld: 96 mg/dL (ref 65–99)
Potassium: 4 mmol/L (ref 3.5–5.3)
Sodium: 139 mmol/L (ref 135–146)
Total Bilirubin: 0.5 mg/dL (ref 0.2–1.2)
Total Protein: 7.1 g/dL (ref 6.1–8.1)

## 2019-10-12 LAB — HIV-1 RNA QUANT-NO REFLEX-BLD
HIV 1 RNA Quant: 24 copies/mL — ABNORMAL HIGH
HIV-1 RNA Quant, Log: 1.38 Log copies/mL — ABNORMAL HIGH

## 2019-10-12 LAB — RPR TITER: RPR Titer: 1:2 {titer} — ABNORMAL HIGH

## 2019-10-12 LAB — FLUORESCENT TREPONEMAL AB(FTA)-IGG-BLD: Fluorescent Treponemal ABS: REACTIVE — AB

## 2019-10-12 LAB — RPR: RPR Ser Ql: REACTIVE — AB

## 2019-10-29 ENCOUNTER — Encounter: Payer: Self-pay | Admitting: Family

## 2019-10-29 ENCOUNTER — Ambulatory Visit (INDEPENDENT_AMBULATORY_CARE_PROVIDER_SITE_OTHER): Payer: Self-pay | Admitting: Family

## 2019-10-29 ENCOUNTER — Other Ambulatory Visit: Payer: Self-pay

## 2019-10-29 VITALS — BP 136/96 | HR 90 | Temp 98.0°F | Ht 68.0 in | Wt 210.0 lb

## 2019-10-29 DIAGNOSIS — B20 Human immunodeficiency virus [HIV] disease: Secondary | ICD-10-CM

## 2019-10-29 DIAGNOSIS — I10 Essential (primary) hypertension: Secondary | ICD-10-CM

## 2019-10-29 MED ORDER — LISINOPRIL 20 MG PO TABS: ORAL_TABLET | ORAL | 0 refills | Status: DC

## 2019-10-29 MED ORDER — BIKTARVY 50-200-25 MG PO TABS: ORAL_TABLET | ORAL | 4 refills | Status: DC

## 2019-10-29 NOTE — Assessment & Plan Note (Signed)
Allen [name] continues to have well-controlled HIV disease with good adherence and tolerance to his ART regimen of Biktarvy.  No signs/symptoms of opportunistic infection or progressive HIV disease.  We reviewed his lab work and discussed the plan of care.  Continue current dose of Biktarvy.  Plan for follow-up in 3 months or sooner if needed with lab work 1 to 2 weeks prior to appointment.

## 2019-10-29 NOTE — Patient Instructions (Signed)
Nice to see you.   Continue to take your medications as prescribed daily.  Refills of the Biktarvy have been sent to the pharmacy.  Plan for follow up in 3 months or sooner if needed with lab work 1-2 weeks prior to appointment.   Have a great day and stay safe!

## 2019-10-29 NOTE — Progress Notes (Addendum)
Subjective:    Patient ID: Allen [name]., male    DOB: November 26, 1985, 34 y.o.   MRN: [id number]  Chief Complaint  Patient presents with  . HIV Positive/AIDS     HPI:  Allen [name]. is a 34 y.o. male with HIV disease who was last seen in the office on 03/20/2019 with apparently well-controlled HIV disease with a viral load that was undetectable and CD4 count of 838.  He subsequently moved to [place] and has since returned to [place].  Most recent blood work completed on 10/09/2019 with a viral load that remains undetectable and CD4 count of 1234.  [name] continues to take his Biktarvy as prescribed with no adverse side effects or missed doses since his last office visit.  Overall feeling well today with no new concerns/complaints. Denies fevers, chills, night sweats, headaches, changes in vision, neck pain/stiffness, nausea, diarrhea, vomiting, lesions or rashes.  [name] has financial assistance through [facility] which is up-to-date.  Denies feelings of being down, depressed, or hopeless recently.  He smokes marijuana every other day and drinks alcohol socially.  He continues to smoke about 1/2 pack of cigarettes per day.  He works full-time at [employer].  He is currently sexually active and requesting condoms.    No Known Allergies    Outpatient Medications Prior to Visit  Medication Sig Dispense Refill  . albuterol (VENTOLIN HFA) 108 (90 Base) MCG/ACT inhaler Inhale 2 puffs into the lungs every 6 (six) hours as needed for wheezing or shortness of breath. 18 g 0  . famotidine (PEPCID) 20 MG tablet Take 20 mg by mouth 2 (two) times daily.    . hydrOXYzine (ATARAX/VISTARIL) 10 MG tablet Take 1 tablet (10 mg total) by mouth 3 (three) times daily as needed. 30 tablet 2  . ibuprofen (ADVIL) 800 MG tablet Take 1 tablet (800 mg total) by mouth 3 (three) times daily. 21 tablet 0  . BIKTARVY 50-200-25 MG TABS tablet TK 1 T PO QD 30 tablet 4  . lisinopril (ZESTRIL) 10 MG tablet TK  1 T PO D 30 tablet 2   No facility-administered medications prior to visit.     Past Medical History:  Diagnosis Date  . Anxiety   . Fracture of metacarpal base of right hand, closed 11/2015   right small finger  . GERD (gastroesophageal reflux disease)    no current med.  Marland Kitchen HIV (human immunodeficiency virus infection) ([place])   . Hypertension      Past Surgical History:  Procedure Laterality Date  . NO PAST SURGERIES    . OPEN REDUCTION INTERNAL FIXATION (ORIF) METACARPAL Right 12/09/2015   Procedure: OPEN REDUCTION INTERNAL FIXATION (ORIF) RIGHT SMALL FINGER/METACARPAL;  Surgeon: Leanora Cover, MD;  Location: [place];  Service: Orthopedics;  Laterality: Right;       Review of Systems  Constitutional: Negative for appetite change, chills, fatigue, fever and unexpected weight change.  Eyes: Negative for visual disturbance.  Respiratory: Negative for cough, chest tightness, shortness of breath and wheezing.   Cardiovascular: Negative for chest pain and leg swelling.  Gastrointestinal: Negative for abdominal pain, constipation, diarrhea, nausea and vomiting.  Genitourinary: Negative for dysuria, flank pain, frequency, genital sores, hematuria and urgency.  Skin: Negative for rash.  Allergic/Immunologic: Negative for immunocompromised state.  Neurological: Negative for dizziness and headaches.      Objective:    BP (!) 136/96   Pulse 90   Temp 98 F (36.7 C)   Ht 5\' 8"  (  1.727 m)   Wt 210 lb (95.3 kg)   BMI 31.93 kg/m  Nursing note and vital signs reviewed.  Physical Exam Constitutional:      General: He is not in acute distress.    Appearance: He is well-developed.  Eyes:     Conjunctiva/sclera: Conjunctivae normal.  Cardiovascular:     Rate and Rhythm: Normal rate and regular rhythm.     Heart sounds: Normal heart sounds. No murmur. No friction rub. No gallop.   Pulmonary:     Effort: Pulmonary effort is normal. No respiratory distress.      Breath sounds: Normal breath sounds. No wheezing or rales.  Chest:     Chest wall: No tenderness.  Abdominal:     General: Bowel sounds are normal.     Palpations: Abdomen is soft.     Tenderness: There is no abdominal tenderness.  Musculoskeletal:     Cervical back: Neck supple.  Lymphadenopathy:     Cervical: No cervical adenopathy.  Skin:    General: Skin is warm and dry.     Findings: No rash.  Neurological:     Mental Status: He is alert and oriented to person, place, and time.  Psychiatric:        Behavior: Behavior normal.        Thought Content: Thought content normal.        Judgment: Judgment normal.     Depression screen PHQ 2/9 03/20/2019  Decreased Interest 0  Down, Depressed, Hopeless 0  PHQ - 2 Score 0       Assessment & Plan:    Patient Active Problem List   Diagnosis Date Noted  . Human immunodeficiency virus (HIV) disease (HCC) 03/20/2019  . Healthcare maintenance 03/20/2019  . Mild intermittent asthma without complication 03/20/2019  . Essential hypertension 03/20/2019  . Anxiety 03/20/2019     Problem List Items Addressed This Visit      Cardiovascular and Mediastinum   Essential hypertension    Blood pressure slightly elevated today above goal 140/90 with no red flag/warning symptoms concerning for endorgan damage or intracranial pathology.  Continue current dose of lisinopril.  Encouraged to monitor blood pressure at home as able.  Continue to monitor.      Relevant Medications   lisinopril (ZESTRIL) 20 MG tablet     Other   Human immunodeficiency virus (HIV) disease (HCC) - Primary    [name] continues to have well-controlled HIV disease with good adherence and tolerance to his ART regimen of Biktarvy.  No signs/symptoms of opportunistic infection or progressive HIV disease.  We reviewed his lab work and discussed the plan of care.  Continue current dose of Biktarvy.  Plan for follow-up in 3 months or sooner if needed with lab work 1 to  2 weeks prior to appointment.      Relevant Medications   BIKTARVY 50-200-25 MG TABS tablet   Other Relevant Orders   T-helper cell (CD4)- ([facility] clinic only)   HIV-1 RNA quant-no reflex-bld   Comprehensive metabolic panel       I have changed Allen Jefferson Jr.'s lisinopril. I am also having him maintain his ibuprofen, albuterol, hydrOXYzine, famotidine, and Biktarvy.   Meds ordered this encounter  Medications  . lisinopril (ZESTRIL) 20 MG tablet    Sig: TK 1 T PO D    Dispense:  30 tablet    Refill:  0    Order Specific Question:   Supervising Provider    Answer:  SNIDER, CYNTHIA [[id number]]  . BIKTARVY 50-200-25 MG TABS tablet    Sig: TK 1 T PO QD    Dispense:  30 tablet    Refill:  4    Order Specific Question:   Supervising Provider    Answer:   Judyann Munson [[id number]]     Follow-up: Return in about 3 months (around 01/29/2020), or if symptoms worsen or fail to improve.   Marcos Eke, MSN, FNP-C Nurse Practitioner [facility] Main number: [phone]

## 2019-10-29 NOTE — Assessment & Plan Note (Signed)
Blood pressure slightly elevated today above goal 140/90 with no red flag/warning symptoms concerning for endorgan damage or intracranial pathology.  Continue current dose of lisinopril.  Encouraged to monitor blood pressure at home as able.  Continue to monitor.

## 2019-12-18 ENCOUNTER — Encounter (HOSPITAL_COMMUNITY): Payer: Self-pay

## 2019-12-18 ENCOUNTER — Ambulatory Visit (HOSPITAL_COMMUNITY)
Admission: RE | Admit: 2019-12-18 | Discharge: 2019-12-18 | Disposition: A | Discharge status: Home / Self Care | Payer: Self-pay | Attending: Psychiatry | Admitting: Psychiatry

## 2019-12-18 ENCOUNTER — Other Ambulatory Visit: Payer: Self-pay

## 2019-12-18 ENCOUNTER — Emergency Department (HOSPITAL_COMMUNITY)
Admission: EM | Admit: 2019-12-18 | Discharge: 2019-12-18 | Disposition: A | Discharge status: Home / Self Care | Payer: Self-pay | Attending: Emergency Medicine | Admitting: Emergency Medicine

## 2019-12-18 ENCOUNTER — Emergency Department (HOSPITAL_COMMUNITY): Payer: Self-pay

## 2019-12-18 DIAGNOSIS — R45851 Suicidal ideations: Secondary | ICD-10-CM | POA: Insufficient documentation

## 2019-12-18 DIAGNOSIS — R05 Cough: Secondary | ICD-10-CM | POA: Insufficient documentation

## 2019-12-18 DIAGNOSIS — F17210 Nicotine dependence, cigarettes, uncomplicated: Secondary | ICD-10-CM | POA: Insufficient documentation

## 2019-12-18 DIAGNOSIS — Z008 Encounter for other general examination: Secondary | ICD-10-CM | POA: Insufficient documentation

## 2019-12-18 DIAGNOSIS — Y906 Blood alcohol level of 120-199 mg/100 ml: Secondary | ICD-10-CM | POA: Insufficient documentation

## 2019-12-18 DIAGNOSIS — I10 Essential (primary) hypertension: Secondary | ICD-10-CM | POA: Insufficient documentation

## 2019-12-18 DIAGNOSIS — R0602 Shortness of breath: Secondary | ICD-10-CM | POA: Insufficient documentation

## 2019-12-18 DIAGNOSIS — B20 Human immunodeficiency virus [HIV] disease: Secondary | ICD-10-CM | POA: Insufficient documentation

## 2019-12-18 DIAGNOSIS — R0981 Nasal congestion: Secondary | ICD-10-CM | POA: Insufficient documentation

## 2019-12-18 DIAGNOSIS — R079 Chest pain, unspecified: Secondary | ICD-10-CM | POA: Insufficient documentation

## 2019-12-18 DIAGNOSIS — F1010 Alcohol abuse, uncomplicated: Secondary | ICD-10-CM | POA: Insufficient documentation

## 2019-12-18 DIAGNOSIS — F1410 Cocaine abuse, uncomplicated: Secondary | ICD-10-CM | POA: Insufficient documentation

## 2019-12-18 DIAGNOSIS — F1210 Cannabis abuse, uncomplicated: Secondary | ICD-10-CM | POA: Insufficient documentation

## 2019-12-18 LAB — RAPID URINE DRUG SCREEN, HOSP PERFORMED
Amphetamines: NOT DETECTED
Barbiturates: NOT DETECTED
Benzodiazepines: NOT DETECTED
Cocaine: POSITIVE — AB
Opiates: NOT DETECTED
Tetrahydrocannabinol: POSITIVE — AB

## 2019-12-18 LAB — TROPONIN I (HIGH SENSITIVITY): Troponin I (High Sensitivity): 2 ng/L (ref <18)

## 2019-12-18 LAB — COMPREHENSIVE METABOLIC PANEL
ALT: 22 U/L (ref 0–44)
AST: 19 U/L (ref 15–41)
Albumin: 4.7 g/dL (ref 3.5–5.0)
Alkaline Phosphatase: 75 U/L (ref 38–126)
Anion gap: 12 (ref 5–15)
BUN: 8 mg/dL (ref 6–20)
CO2: 23 mmol/L (ref 22–32)
Calcium: 9.4 mg/dL (ref 8.9–10.3)
Chloride: 104 mmol/L (ref 98–111)
Creatinine, Ser: 1.01 mg/dL (ref 0.61–1.24)
GFR calc Af Amer: >60 mL/min (ref >60)
GFR calc non Af Amer: >60 mL/min (ref >60)
Glucose, Bld: 120 mg/dL — ABNORMAL HIGH (ref 70–99)
Potassium: 4 mmol/L (ref 3.5–5.1)
Sodium: 139 mmol/L (ref 135–145)
Total Bilirubin: 0.5 mg/dL (ref 0.3–1.2)
Total Protein: 8 g/dL (ref 6.5–8.1)

## 2019-12-18 LAB — CBC
HCT: 44.1 % (ref 39.0–52.0)
Hemoglobin: 14.7 g/dL (ref 13.0–17.0)
MCH: 27.8 pg (ref 26.0–34.0)
MCHC: 33.3 g/dL (ref 30.0–36.0)
MCV: 83.4 fL (ref 80.0–100.0)
Platelets: 304 10*3/uL (ref 150–400)
RBC: 5.29 MIL/uL (ref 4.22–5.81)
RDW: 14.3 % (ref 11.5–15.5)
WBC: 4.7 10*3/uL (ref 4.0–10.5)
nRBC: 0 % (ref 0.0–0.2)

## 2019-12-18 LAB — ACETAMINOPHEN LEVEL: Acetaminophen (Tylenol), Serum: <10 ug/mL — ABNORMAL LOW (ref 10–30)

## 2019-12-18 LAB — ETHANOL: Alcohol, Ethyl (B): 123 mg/dL — ABNORMAL HIGH (ref <10)

## 2019-12-18 LAB — SALICYLATE LEVEL: Salicylate Lvl: <7 mg/dL — ABNORMAL LOW (ref 7.0–30.0)

## 2019-12-18 MED ORDER — ALUM & MAG HYDROXIDE-SIMETH 200-200-20 MG/5ML PO SUSP
30.0000 mL | Freq: Once | ORAL | Status: DC
  Filled 2019-12-18: qty 30

## 2019-12-18 NOTE — ED Notes (Signed)
Patient refused covid swab

## 2019-12-18 NOTE — Discharge Instructions (Signed)
Please follow up with your primary care provider within 5-7 days for re-evaluation of your symptoms. If you do not have a primary care provider, information for a healthcare clinic has been provided for you to make arrangements for follow up care. Please return to the emergency department for any new or worsening symptoms. ° °

## 2019-12-18 NOTE — Progress Notes (Signed)
Pt arrived with [employer]. Pt increasingly depressed and anxious. Denied SI/HI/AVH once he spoke with provider. Reported chest pain and tightness 8/10. BP and pulse elevated. Reported cocaine/marijuna/alcohol use around 12am. Alert and oriented, did not present with any other symptoms r/t chest pain except slight shortness of breath. NP assessed and pt will be sent to ED for further eval and med clearance.

## 2019-12-18 NOTE — H&P (Signed)
Behavioral Health Medical Screening Exam  Allen [name]. is an 34 y.o. male who presents to [facility] as a walk-in due to worsening depression,anxiety, and suicidal thoughts. Reports that he last used cocaine/marijuna/alcohol around 12 am. He reports chest pain/tightness of 8/10. Endorses shortness of breath. Denies recent injuries.   Total Time spent with patient: 15 minutes  Psychiatric Specialty Exam: Physical Exam  Nursing note and vitals reviewed. Constitutional: He is oriented to person, place, and time. He appears well-developed and well-nourished. No distress.  HENT:  Head: Normocephalic and atraumatic.  Right Ear: External ear normal.  Left Ear: External ear normal.  Eyes: Pupils are equal, round, and reactive to light. Right eye exhibits no discharge. Left eye exhibits no discharge.  Cardiovascular: Tachycardia present.  Respiratory: Effort normal. No respiratory distress.  Musculoskeletal:        General: Normal range of motion.  Neurological: He is alert and oriented to person, place, and time.  Skin: He is not diaphoretic.    Review of Systems  Constitutional: Positive for activity change, appetite change and diaphoresis. Negative for chills, fatigue, fever and unexpected weight change.  HENT: Positive for congestion.   Respiratory: Positive for chest tightness and shortness of breath.   Cardiovascular: Positive for chest pain.  Gastrointestinal: Negative for diarrhea, nausea and vomiting.  Musculoskeletal: Negative.   Psychiatric/Behavioral: Positive for dysphoric mood, sleep disturbance and suicidal ideas. The patient is nervous/anxious.   All other systems reviewed and are negative.   Blood pressure (!) 134/97, pulse (!) 120, temperature 98.7 F (37.1 C), temperature source Oral, resp. rate 18, SpO2 99 %.There is no height or weight on file to calculate BMI.  General Appearance: Casual  Eye Contact:  Fair  Speech:  Clear and Coherent and Normal Rate  Volume:   Decreased  Mood:  Anxious, Depressed, Hopeless, Irritable and Worthless  Affect:  Congruent  Thought Process:  Coherent  Orientation:  Full (Time, Place, and Person)  Thought Content:  Logical  Suicidal Thoughts:  Yes.  with intent/plan  Homicidal Thoughts:  No  Memory:  Immediate;   Fair Recent;   Fair Remote;   Fair  Judgement:  Intact  Insight:  Lacking  Psychomotor Activity:  Increased  Concentration: Concentration: Fair and Attention Span: Fair  Recall:  Good  Fund of Knowledge:Good  Language: Good  Akathisia:  Negative  Handed:  Right  AIMS (if indicated):     Assets:  Communication Skills Desire for Improvement Leisure Time Physical Health Resilience  Sleep:       Musculoskeletal: Strength & Muscle Tone: within normal limits Gait & Station: normal Patient leans: N/A  Blood pressure (!) 134/97, pulse (!) 120, temperature 98.7 F (37.1 C), temperature source Oral, resp. rate 18, SpO2 99 %.  Recommendations:  Based on my evaluation the patient does not appear to have an emergency medical condition.   Patient reports chest pain 8/10, SOB. HR 120.  Reports recent use of cocaine/alcohol. Will need medical clearance.   Jackelyn Poling, NP 12/18/2019, 5:50 AM

## 2019-12-18 NOTE — ED Provider Notes (Addendum)
[facility]-EMERGENCY DEPT Provider Note   CSN: [id number] Arrival date & time: 12/18/19  3762     History Chief Complaint  Patient presents with  . Medical Clearance    Allen [name]. is a 34 y.o. male.  HPI   Patient is a 34 year old male with a history of anxiety, GERD, HIV, hypertension, who presents to the emergency department today for medical clearance.  Patient was seen at [facility] prior to arrival and sent here for medical evaluation.  He is complaining of chest pain that woke him up yesterday morning.  It is located in the center of his chest and is rated a 7-8/10.  Pain has been intermittent since it started.  States it feels like a stabbing and burning pain.  States it intermittently feels consistent with heartburn.  It is also worsened with anxiety.  It is nonexertional and nonradiating.  States it usually wakes him up from sleep, he will drink a cup of water and then fall back to sleep.  Denies any other interventions for symptoms.  He denies any recent fevers.  He does report that he has had some nasal congestion, a cough and chronic shortness of breath that he states is consistent with his history of allergies.  Denies leg pain/swelling, hemoptysis, recent surgery, recent long travel, hormone use, personal hx of cancer, or hx of DVT/PE.   States that last night he used THC, drink several beers and used cocaine.  Past Medical History:  Diagnosis Date  . Anxiety   . Fracture of metacarpal base of right hand, closed 11/2015   right small finger  . GERD (gastroesophageal reflux disease)    no current med.  Marland Kitchen HIV (human immunodeficiency virus infection) (HCC)   . Hypertension     Patient Active Problem List   Diagnosis Date Noted  . Human immunodeficiency virus (HIV) disease (HCC) 03/20/2019  . Healthcare maintenance 03/20/2019  . Mild intermittent asthma without complication 03/20/2019  . Essential hypertension 03/20/2019  . Anxiety 03/20/2019     Past Surgical History:  Procedure Laterality Date  . NO PAST SURGERIES    . OPEN REDUCTION INTERNAL FIXATION (ORIF) METACARPAL Right 12/09/2015   Procedure: OPEN REDUCTION INTERNAL FIXATION (ORIF) RIGHT SMALL FINGER/METACARPAL;  Surgeon: Betha Loa, MD;  Location: [facility];  Service: Orthopedics;  Laterality: Right;       Family History  Problem Relation Age of Onset  . Cancer Mother     Social History   Tobacco Use  . Smoking status: Current Every Day Smoker    Packs/day: 0.50    Years: 10.00    Pack years: 5.00    Types: Cigarettes  . Smokeless tobacco: Never Used  Substance Use Topics  . Alcohol use: Yes    Comment: socially  . Drug use: Yes    Types: Marijuana, Cocaine    Home Medications Prior to Admission medications   Medication Sig Start Date End Date Taking? Authorizing Provider  albuterol (VENTOLIN HFA) 108 (90 Base) MCG/ACT inhaler Inhale 2 puffs into the lungs every 6 (six) hours as needed for wheezing or shortness of breath. 03/20/19  Yes Veryl Speak, FNP  BIKTARVY 50-200-25 MG TABS tablet TK 1 T PO QD Patient taking differently: Take 1 tablet by mouth daily. TK 1 T PO Q 10/29/19  Yes Veryl Speak, FNP  hydrOXYzine (ATARAX/VISTARIL) 10 MG tablet Take 1 tablet (10 mg total) by mouth 3 (three) times daily as needed. Patient not taking: Reported  on 12/18/2019 03/20/19   Golden Circle, FNP  ibuprofen (ADVIL) 800 MG tablet Take 1 tablet (800 mg total) by mouth 3 (three) times daily. Patient not taking: Reported on 12/18/2019 03/20/19   Golden Circle, FNP  lisinopril (ZESTRIL) 20 MG tablet TK 1 T PO D Patient not taking: Reported on 12/18/2019 10/29/19   Golden Circle, FNP    Allergies    Patient has no known allergies.  Review of Systems   Review of Systems  Constitutional: Negative for chills and fever.  HENT: Positive for congestion. Negative for ear pain and sore throat.   Eyes: Negative for visual disturbance.   Respiratory: Positive for cough and shortness of breath.   Cardiovascular: Positive for chest pain.  Gastrointestinal: Negative for abdominal pain, constipation, diarrhea, nausea and vomiting.  Genitourinary: Negative for dysuria and hematuria.  Musculoskeletal: Negative for back pain.  Skin: Negative for rash.  Neurological: Negative for headaches.  All other systems reviewed and are negative.   Physical Exam Updated Vital Signs BP (!) 122/96 (BP Location: Left Arm)   Pulse (!) 108   Temp 98 F (36.7 C) (Oral)   Resp 20   Ht 5\' 8"  (1.727 m)   Wt 90 kg   SpO2 100%   BMI 30.17 kg/m   Physical Exam Vitals and nursing note reviewed.  Constitutional:      Appearance: He is well-developed.  HENT:     Head: Normocephalic and atraumatic.  Eyes:     Conjunctiva/sclera: Conjunctivae normal.  Cardiovascular:     Rate and Rhythm: Normal rate and regular rhythm.     Heart sounds: Normal heart sounds. No murmur.  Pulmonary:     Effort: Pulmonary effort is normal. No respiratory distress.     Breath sounds: Normal breath sounds. No wheezing, rhonchi or rales.  Abdominal:     General: Bowel sounds are normal.     Palpations: Abdomen is soft.     Tenderness: There is abdominal tenderness (mild epigastric ttp).  Musculoskeletal:     Cervical back: Neck supple.  Skin:    General: Skin is warm and dry.  Neurological:     Mental Status: He is alert.     ED Results / Procedures / Treatments   Labs (all labs ordered are listed, but only abnormal results are displayed) Labs Reviewed  COMPREHENSIVE METABOLIC PANEL - Abnormal; Notable for the following components:      Result Value   Glucose, Bld 120 (*)    All other components within normal limits  ETHANOL - Abnormal; Notable for the following components:   Alcohol, Ethyl (B) 123 (*)    All other components within normal limits  SALICYLATE LEVEL - Abnormal; Notable for the following components:   Salicylate Lvl <2.7 (*)     All other components within normal limits  ACETAMINOPHEN LEVEL - Abnormal; Notable for the following components:   Acetaminophen (Tylenol), Serum <10 (*)    All other components within normal limits  RAPID URINE DRUG SCREEN, HOSP PERFORMED - Abnormal; Notable for the following components:   Cocaine POSITIVE (*)    Tetrahydrocannabinol POSITIVE (*)    All other components within normal limits  SARS CORONAVIRUS 2 BY RT PCR (HOSPITAL ORDER, [place] [facility])  CBC  TROPONIN I (HIGH SENSITIVITY)    EKG None  Radiology DG Chest Portable 1 View  Result Date: 12/18/2019 CLINICAL DATA:  Cough and chest pain EXAM: PORTABLE CHEST 1 VIEW COMPARISON:  11/18/2015  FINDINGS: Normal heart size and mediastinal contours. No acute infiltrate or edema. No effusion or pneumothorax. No acute osseous findings. IMPRESSION: Negative chest. Electronically Signed   By: Marnee Spring M.D.   On: 12/18/2019 07:52    Procedures Procedures (including critical care time)  Medications Ordered in ED Medications  alum & mag hydroxide-simeth (MAALOX/MYLANTA) 200-200-20 MG/5ML suspension 30 mL (30 mLs Oral Refused 12/18/19 1018)    ED Course  I have reviewed the triage vital signs and the nursing notes.  Pertinent labs & imaging results that were available during my care of the patient were reviewed by me and considered in my medical decision making (see chart for details).    MDM Rules/Calculators/A&P                      34 y/o M presenting for medical clearance. He was seen at [facility] for SI and send here for clearance. He is c/o chest pain.   CBC wnl CMP nonacute ETOH marginally elevated - pt clinically sober on exam Salicylate and acetaminophen level negative Trop neg  EKG with NSR, early repol, unchanged from prior  CXR reviewed/interpreted - no ptx, pna, or other acute cardiopulmonary abnormality  GI cocktail was ordered for patient however he declined this medication.   I  was informed by nursing staff that pt stated he wants to leave. I evaluated that patient and he became upset stating that he no longer wants to be in the ED and would like to leave. We discussed that he was initially c/o suicidal thoughts at [facility]. He states he is no longer feeling suicidal and does not have a plan to harm himself. He is very adamant that he is not suicidal. He also denies homicidal ideations. He states he would like to be discharged so he can go to work. At this time he does not appear to meet criteria for IVC. Additionally, in regards to his CP, I think this is more related to acid reflux rather than acute cardiopulmonary cause.  He was given information to f/u with pcp and [facility]. Advised on return precautions. He voices understanding and is in agreement. All questions answered, pt stable for d/c.   Final Clinical Impression(s) / ED Diagnoses Final diagnoses:  Encounter for psychological evaluation    Rx / DC Orders ED Discharge Orders    None       Karrie Meres, PA-C 12/18/19 1046    Buford Bremer S, PA-C 12/18/19 1049    Cathren Laine, MD 12/18/19 1550

## 2019-12-18 NOTE — ED Triage Notes (Addendum)
Sent from [facility] voluntary for evaluation after multiple drug use and c/o headache and chest pains.

## 2019-12-21 ENCOUNTER — Ambulatory Visit (HOSPITAL_COMMUNITY)
Admission: EM | Admit: 2019-12-21 | Discharge: 2019-12-21 | Disposition: A | Discharge status: Home / Self Care | Payer: HRSA Program | Attending: Urgent Care | Admitting: Urgent Care

## 2019-12-21 ENCOUNTER — Encounter (HOSPITAL_COMMUNITY): Payer: Self-pay

## 2019-12-21 ENCOUNTER — Other Ambulatory Visit: Payer: Self-pay

## 2019-12-21 ENCOUNTER — Telehealth: Payer: Self-pay

## 2019-12-21 DIAGNOSIS — Z79899 Other long term (current) drug therapy: Secondary | ICD-10-CM | POA: Diagnosis not present

## 2019-12-21 DIAGNOSIS — F17210 Nicotine dependence, cigarettes, uncomplicated: Secondary | ICD-10-CM | POA: Insufficient documentation

## 2019-12-21 DIAGNOSIS — J3089 Other allergic rhinitis: Secondary | ICD-10-CM

## 2019-12-21 DIAGNOSIS — R5383 Other fatigue: Secondary | ICD-10-CM | POA: Diagnosis not present

## 2019-12-21 DIAGNOSIS — I10 Essential (primary) hypertension: Secondary | ICD-10-CM | POA: Insufficient documentation

## 2019-12-21 DIAGNOSIS — R0602 Shortness of breath: Secondary | ICD-10-CM

## 2019-12-21 DIAGNOSIS — Z20822 Contact with and (suspected) exposure to covid-19: Secondary | ICD-10-CM | POA: Insufficient documentation

## 2019-12-21 DIAGNOSIS — Z791 Long term (current) use of non-steroidal anti-inflammatories (NSAID): Secondary | ICD-10-CM | POA: Insufficient documentation

## 2019-12-21 DIAGNOSIS — J309 Allergic rhinitis, unspecified: Secondary | ICD-10-CM | POA: Insufficient documentation

## 2019-12-21 DIAGNOSIS — Z7951 Long term (current) use of inhaled steroids: Secondary | ICD-10-CM | POA: Insufficient documentation

## 2019-12-21 DIAGNOSIS — R059 Cough, unspecified: Secondary | ICD-10-CM

## 2019-12-21 DIAGNOSIS — R05 Cough: Secondary | ICD-10-CM | POA: Diagnosis not present

## 2019-12-21 DIAGNOSIS — J4540 Moderate persistent asthma, uncomplicated: Secondary | ICD-10-CM | POA: Diagnosis not present

## 2019-12-21 DIAGNOSIS — F1490 Cocaine use, unspecified, uncomplicated: Secondary | ICD-10-CM

## 2019-12-21 DIAGNOSIS — R0981 Nasal congestion: Secondary | ICD-10-CM | POA: Diagnosis not present

## 2019-12-21 DIAGNOSIS — R000 Tachycardia, unspecified: Secondary | ICD-10-CM | POA: Insufficient documentation

## 2019-12-21 DIAGNOSIS — M791 Myalgia, unspecified site: Secondary | ICD-10-CM | POA: Diagnosis not present

## 2019-12-21 DIAGNOSIS — B20 Human immunodeficiency virus [HIV] disease: Secondary | ICD-10-CM | POA: Insufficient documentation

## 2019-12-21 DIAGNOSIS — R5381 Other malaise: Secondary | ICD-10-CM | POA: Insufficient documentation

## 2019-12-21 DIAGNOSIS — J4520 Mild intermittent asthma, uncomplicated: Secondary | ICD-10-CM

## 2019-12-21 MED ORDER — CETIRIZINE HCL 10 MG PO TABS: 10.0000 mg | ORAL_TABLET | Freq: Every day | ORAL | 0 refills | Status: AC

## 2019-12-21 MED ORDER — ALBUTEROL SULFATE HFA 108 (90 BASE) MCG/ACT IN AERS: 2.0000 | INHALATION_SPRAY | Freq: Four times a day (QID) | RESPIRATORY_TRACT | 0 refills | Status: DC | PRN

## 2019-12-21 MED ORDER — HYDROXYZINE HCL 25 MG PO TABS
25.0000 mg | ORAL_TABLET | Freq: Three times a day (TID) | ORAL | 0 refills | Status: DC | PRN
Start: 2019-12-21 — End: 2020-10-06

## 2019-12-21 MED ORDER — PREDNISONE 20 MG PO TABS: ORAL_TABLET | ORAL | 0 refills | Status: DC

## 2019-12-21 NOTE — Discharge Instructions (Addendum)
We will notify you of your COVID-19 test results as they arrive and may take between 24 to 48 hours.  I encourage you to sign up for MyChart if you have not already done so as this can be the easiest way for us to communicate results to you online or through a phone app.  In the meantime, if you develop worsening symptoms including fever, chest pain, shortness of breath despite our current treatment plan then please report to the emergency room as this may be a sign of worsening status from possible COVID-19 infection.  Otherwise, we will manage this as a viral syndrome. For sore throat or cough try using a honey-based tea. Use 3 teaspoons of honey with juice squeezed from half lemon. Place shaved pieces of ginger into 1/2-1 cup of water and warm over stove top. Then mix the ingredients and repeat every 4 hours as needed. Please take Tylenol 500mg-650mg every 6 hours for aches and pains, fevers. Hydrate very well with at least 2 liters of water. Eat light meals such as soups to replenish electrolytes and soft fruits, veggies. Start an antihistamine like Zyrtec for postnasal drainage, sinus congestion. 

## 2019-12-21 NOTE — ED Provider Notes (Signed)
[facility]   MRN: [id number] DOB: 1986/05/18  Subjective:   Allen [name]. is a 34 y.o. male presenting for 3-day history of persistent moderate to severe sinus congestion, intermittently productive cough, shortness of breath.  Patient went to the ER on 12/18/2019 and had complete work-up which was negative.  He did test positive for cocaine use.  Has a history of asthma, uses albuterol inhaler once every 4 hours this past week.  Needs a refill of this.  Has used ibuprofen but ran out.  No current facility-administered medications for this encounter.  Current Outpatient Medications:  .  albuterol (VENTOLIN HFA) 108 (90 Base) MCG/ACT inhaler, Inhale 2 puffs into the lungs every 6 (six) hours as needed for wheezing or shortness of breath., Disp: 18 g, Rfl: 0 .  BIKTARVY 50-200-25 MG TABS tablet, TK 1 T PO QD (Patient taking differently: Take 1 tablet by mouth daily. TK 1 T PO Q), Disp: 30 tablet, Rfl: 4 .  hydrOXYzine (ATARAX/VISTARIL) 10 MG tablet, Take 1 tablet (10 mg total) by mouth 3 (three) times daily as needed. (Patient not taking: Reported on 12/18/2019), Disp: 30 tablet, Rfl: 2 .  ibuprofen (ADVIL) 800 MG tablet, Take 1 tablet (800 mg total) by mouth 3 (three) times daily. (Patient not taking: Reported on 12/18/2019), Disp: 21 tablet, Rfl: 0 .  lisinopril (ZESTRIL) 20 MG tablet, TK 1 T PO D (Patient not taking: Reported on 12/18/2019), Disp: 30 tablet, Rfl: 0   No Known Allergies  Past Medical History:  Diagnosis Date  . Anxiety   . Fracture of metacarpal base of right hand, closed 11/2015   right small finger  . GERD (gastroesophageal reflux disease)    no current med.  Marland Kitchen HIV (human immunodeficiency virus infection) (HCC)   . Hypertension      Past Surgical History:  Procedure Laterality Date  . NO PAST SURGERIES    . OPEN REDUCTION INTERNAL FIXATION (ORIF) METACARPAL Right 12/09/2015   Procedure: OPEN REDUCTION INTERNAL FIXATION (ORIF) RIGHT SMALL  FINGER/METACARPAL;  Surgeon: Betha Loa, MD;  Location: [facility];  Service: Orthopedics;  Laterality: Right;    Family History  Problem Relation Age of Onset  . Cancer Mother     Social History   Tobacco Use  . Smoking status: Current Every Day Smoker    Packs/day: 0.50    Years: 10.00    Pack years: 5.00    Types: Cigarettes  . Smokeless tobacco: Never Used  Substance Use Topics  . Alcohol use: Yes    Comment: socially  . Drug use: Yes    Types: Marijuana, Cocaine    Review of Systems  Constitutional: Positive for malaise/fatigue.  HENT: Positive for congestion.   Respiratory: Positive for cough and shortness of breath.   Musculoskeletal: Positive for myalgias.     Objective:   Vitals: BP 125/86 (BP Location: Right Arm)   Pulse (!) 104   Temp 98.1 F (36.7 C) (Oral)   Resp 19   SpO2 100%   Physical Exam Constitutional:      General: He is not in acute distress.    Appearance: Normal appearance. He is well-developed. He is not ill-appearing, toxic-appearing or diaphoretic.  HENT:     Head: Normocephalic and atraumatic.     Right Ear: External ear normal.     Left Ear: External ear normal.     Nose: Congestion and rhinorrhea present.     Mouth/Throat:     Mouth: Mucous membranes  are moist.     Comments: Significant post-nasal drainage. Eyes:     General: No scleral icterus.       Right eye: No discharge.        Left eye: No discharge.     Extraocular Movements: Extraocular movements intact.     Pupils: Pupils are equal, round, and reactive to light.  Cardiovascular:     Rate and Rhythm: Regular rhythm. Tachycardia present.     Heart sounds: Normal heart sounds. No murmur. No friction rub. No gallop.      Comments: Borderline tachycardia.  Pulmonary:     Effort: Pulmonary effort is normal. No respiratory distress.     Breath sounds: Normal breath sounds. No stridor. No wheezing, rhonchi or rales.  Skin:    General: Skin is warm and  dry.  Neurological:     Mental Status: He is alert and oriented to person, place, and time.  Psychiatric:        Thought Content: Thought content normal.     Comments: Anxious demeanor, rapid and pressured speech.     DG Chest Portable 1 View  Result Date: 12/18/2019 CLINICAL DATA:  Cough and chest pain EXAM: PORTABLE CHEST 1 VIEW COMPARISON:  11/18/2015 FINDINGS: Normal heart size and mediastinal contours. No acute infiltrate or edema. No effusion or pneumothorax. No acute osseous findings. IMPRESSION: Negative chest. Electronically Signed   By: Marnee Spring M.D.   On: 12/18/2019 07:52    Recent Results (from the past 2160 hour(s))  T-helper cell (CD4)- ([facility] clinic only)     Status: None   Collection Time: 10/09/19 10:04 AM  Result Value Ref Range   CD4 T Cell Abs 1,234 400 - 1,790 /uL   CD4 % Helper T Cell 56 33 - 65 %    Comment: Performed at [facility], [address], [place], [place] [address]  RPR     Status: Abnormal   Collection Time: 10/09/19 10:06 AM  Result Value Ref Range   RPR Ser Ql REACTIVE (A) NON-REACTI  Comprehensive metabolic panel     Status: None   Collection Time: 10/09/19 10:06 AM  Result Value Ref Range   Glucose, Bld 96 65 - 99 mg/dL    Comment: .            Fasting reference interval .    BUN 8 7 - 25 mg/dL   Creat 0.63 0.16 - 0.10 mg/dL   BUN/Creatinine Ratio NOT APPLICABLE 6 - 22 (calc)   Sodium 139 135 - 146 mmol/L   Potassium 4.0 3.5 - 5.3 mmol/L   Chloride 102 98 - 110 mmol/L   CO2 27 20 - 32 mmol/L   Calcium 10.0 8.6 - 10.3 mg/dL   Total Protein 7.1 6.1 - 8.1 g/dL   Albumin 4.7 3.6 - 5.1 g/dL   Globulin 2.4 1.9 - 3.7 g/dL (calc)   AG Ratio 2.0 1.0 - 2.5 (calc)   Total Bilirubin 0.5 0.2 - 1.2 mg/dL   Alkaline phosphatase (APISO) 66 36 - 130 U/L   AST 14 10 - 40 U/L   ALT 13 9 - 46 U/L  HIV-1 RNA quant-no reflex-bld     Status: Abnormal   Collection Time: 10/09/19 10:06 AM  Result Value Ref Range   HIV 1 RNA  Quant 24 (H) NOT DETECT copies/mL   HIV-1 RNA Quant, Log 1.38 (H) NOT DETECT Log copies/mL    Comment: . This test was performed using Real-Time Polymerase Chain  Reaction. . Reportable Range: 20 copies/mL to 10,000,000 copies/mL (1.30 log copies/mL to 7.00 log copies/mL).   Rpr titer     Status: Abnormal   Collection Time: 10/09/19 10:06 AM  Result Value Ref Range   RPR Titer 1:2 (H)   Fluorescent treponemal ab(fta)-IgG-bld     Status: Abnormal   Collection Time: 10/09/19 10:06 AM  Result Value Ref Range   Fluorescent Treponemal ABS REACTIVE (A) NON-REACTI  Comprehensive metabolic panel     Status: Abnormal   Collection Time: 12/18/19  6:52 AM  Result Value Ref Range   Sodium 139 135 - 145 mmol/L   Potassium 4.0 3.5 - 5.1 mmol/L   Chloride 104 98 - 111 mmol/L   CO2 23 22 - 32 mmol/L   Glucose, Bld 120 (H) 70 - 99 mg/dL    Comment: Glucose reference range applies only to samples taken after fasting for at least 8 hours.   BUN 8 6 - 20 mg/dL   Creatinine, Ser 9.89 0.61 - 1.24 mg/dL   Calcium 9.4 8.9 - 21.1 mg/dL   Total Protein 8.0 6.5 - 8.1 g/dL   Albumin 4.7 3.5 - 5.0 g/dL   AST 19 15 - 41 U/L   ALT 22 0 - 44 U/L   Alkaline Phosphatase 75 38 - 126 U/L   Total Bilirubin 0.5 0.3 - 1.2 mg/dL   GFR calc non Af Amer >60 >60 mL/min   GFR calc Af Amer >60 >60 mL/min   Anion gap 12 5 - 15    Comment: Performed at [facility], [address] [place], [place] [address]  Ethanol     Status: Abnormal   Collection Time: 12/18/19  6:52 AM  Result Value Ref Range   Alcohol, Ethyl (B) 123 (H) <10 mg/dL    Comment: (NOTE) Lowest detectable limit for serum alcohol is 10 mg/dL. For medical purposes only. Performed at [facility], [address], [place], [place] [address]   Salicylate level     Status: Abnormal   Collection Time: 12/18/19  6:52 AM  Result Value Ref Range   Salicylate Lvl <7.0 (L) 7.0 - 30.0 mg/dL    Comment: Performed at  [facility], [address], [place], [place] [address]  Acetaminophen level     Status: Abnormal   Collection Time: 12/18/19  6:52 AM  Result Value Ref Range   Acetaminophen (Tylenol), Serum <10 (L) 10 - 30 ug/mL    Comment: (NOTE) Therapeutic concentrations vary significantly. A range of 10-30 ug/mL  may be an effective concentration for many patients. However, some  are best treated at concentrations outside of this range. Acetaminophen concentrations >150 ug/mL at 4 hours after ingestion  and >50 ug/mL at 12 hours after ingestion are often associated with  toxic reactions. Performed at [facility] [place] [facility], [address], [place], [place] [address]   cbc     Status: None   Collection Time: 12/18/19  6:52 AM  Result Value Ref Range   WBC 4.7 4.0 - 10.5 K/uL   RBC 5.29 4.22 - 5.81 MIL/uL   Hemoglobin 14.7 13.0 - 17.0 g/dL   HCT 02.6 37.8 - 58.8 %   MCV 83.4 80.0 - 100.0 fL   MCH 27.8 26.0 - 34.0 pg   MCHC 33.3 30.0 - 36.0 g/dL   RDW 50.2 77.4 - 12.8 %   Platelets 304 150 - 400 K/uL   nRBC 0.0 0.0 - 0.2 %  Comment: Performed at [facility], [place] [address] [place], [place] [address]  Troponin I (High Sensitivity)     Status: None   Collection Time: 12/18/19  6:52 AM  Result Value Ref Range   Troponin I (High Sensitivity) 2 <18 ng/L    Comment: (NOTE) Elevated high sensitivity troponin I (hsTnI) values and significant  changes across serial measurements may suggest ACS but many other  chronic and acute conditions are known to elevate hsTnI results.  Refer to the "Links" section for chest pain algorithms and additional  guidance. Performed at [facility], [place] [address] [place], [place] [address]   Rapid urine drug screen (hospital performed)     Status: Abnormal   Collection Time: 12/18/19  6:55 AM  Result Value Ref Range   Opiates NONE DETECTED NONE DETECTED   Cocaine POSITIVE (A) NONE DETECTED     Benzodiazepines NONE DETECTED NONE DETECTED   Amphetamines NONE DETECTED NONE DETECTED   Tetrahydrocannabinol POSITIVE (A) NONE DETECTED   Barbiturates NONE DETECTED NONE DETECTED    Comment: (NOTE) DRUG SCREEN FOR MEDICAL PURPOSES ONLY.  IF CONFIRMATION IS NEEDED FOR ANY PURPOSE, NOTIFY LAB WITHIN 5 DAYS. LOWEST DETECTABLE LIMITS FOR URINE DRUG SCREEN Drug Class                     Cutoff (ng/mL) Amphetamine and metabolites    1000 Barbiturate and metabolites    200 Benzodiazepine                 627 Tricyclics and metabolites     300 Opiates and metabolites        300 Cocaine and metabolites        300 THC                            50 Performed at [facility], [place] [address], [place], [place] [address]      Assessment and Plan :   PDMP not reviewed this encounter.  1. Nasal congestion   2. Cough   3. Shortness of breath   4. Cocaine use   5. Mild intermittent asthma without complication   6. Allergic rhinitis due to other allergic trigger, unspecified seasonality   7. Moderate persistent asthma without complication     Use prednisone course given history of allergic rhinitis and persistent use of his albuterol inhaler.  Recent work-up was negative but COVID-19 test was not obtained and therefore was done today, is pending.  Recommend supportive care otherwise.  Prescription is printed for patient as he has specific requirements from his insurance.  Refilled his hydroxyzine for anxiety as well. Counseled patient on potential for adverse effects with medications prescribed/recommended today, ER and return-to-clinic precautions discussed, patient verbalized understanding.    Jaynee Eagles, [place] 12/21/19 1644

## 2019-12-21 NOTE — Telephone Encounter (Signed)
Patient called office today requesting emergency appointment with office due to anxiety attack. Patient states his anxiety has been more frequent since stopping lisinopril.  States his blood pressure has been elevated since stopping medication dur to cough. Is also complaining of constant sweating and shivering. Patient advised to go to Urgent Care ED for evaluation.  Allen [name], [place]

## 2019-12-21 NOTE — ED Triage Notes (Signed)
Pt reports cough, nasal congestion and shortness of breath on exertion x 4 days after he was at the ED for anxiety check up.

## 2019-12-22 LAB — SARS CORONAVIRUS 2 (TAT 6-24 HRS): SARS Coronavirus 2: NEGATIVE

## 2020-01-09 ENCOUNTER — Other Ambulatory Visit: Payer: Self-pay

## 2020-01-10 ENCOUNTER — Other Ambulatory Visit: Payer: Self-pay

## 2020-01-15 ENCOUNTER — Other Ambulatory Visit: Payer: Self-pay

## 2020-01-15 DIAGNOSIS — B20 Human immunodeficiency virus [HIV] disease: Secondary | ICD-10-CM

## 2020-01-16 LAB — T-HELPER CELL (CD4) - (RCID CLINIC ONLY)
CD4 % Helper T Cell: 55 % (ref 33–65)
CD4 T Cell Abs: 1403 /uL (ref 400–1790)

## 2020-01-16 LAB — COMPREHENSIVE METABOLIC PANEL
AG Ratio: 2 (calc) (ref 1.0–2.5)
ALT: 17 U/L (ref 9–46)
AST: 21 U/L (ref 10–40)
Albumin: 4.5 g/dL (ref 3.6–5.1)
Alkaline phosphatase (APISO): 92 U/L (ref 36–130)
BUN: 11 mg/dL (ref 7–25)
CO2: 25 mmol/L (ref 20–32)
Calcium: 10.1 mg/dL (ref 8.6–10.3)
Chloride: 104 mmol/L (ref 98–110)
Creat: 1.3 mg/dL (ref 0.60–1.35)
Globulin: 2.3 g/dL (calc) (ref 1.9–3.7)
Glucose, Bld: 101 mg/dL — ABNORMAL HIGH (ref 65–99)
Potassium: 4.4 mmol/L (ref 3.5–5.3)
Sodium: 140 mmol/L (ref 135–146)
Total Bilirubin: 0.4 mg/dL (ref 0.2–1.2)
Total Protein: 6.8 g/dL (ref 6.1–8.1)

## 2020-01-16 LAB — HIV-1 RNA QUANT-NO REFLEX-BLD
HIV 1 RNA Quant: 30 copies/mL — ABNORMAL HIGH
HIV-1 RNA Quant, Log: 1.48 Log copies/mL — ABNORMAL HIGH

## 2020-01-24 ENCOUNTER — Encounter: Payer: Self-pay | Admitting: Family

## 2020-02-01 ENCOUNTER — Encounter: Payer: Self-pay | Admitting: Family

## 2020-02-28 ENCOUNTER — Encounter: Payer: Self-pay | Admitting: Family

## 2020-02-28 ENCOUNTER — Other Ambulatory Visit: Payer: Self-pay

## 2020-02-28 ENCOUNTER — Telehealth (INDEPENDENT_AMBULATORY_CARE_PROVIDER_SITE_OTHER): Payer: Self-pay | Admitting: Family

## 2020-02-28 DIAGNOSIS — K2100 Gastro-esophageal reflux disease with esophagitis, without bleeding: Secondary | ICD-10-CM

## 2020-02-28 DIAGNOSIS — K219 Gastro-esophageal reflux disease without esophagitis: Secondary | ICD-10-CM | POA: Insufficient documentation

## 2020-02-28 DIAGNOSIS — R0981 Nasal congestion: Secondary | ICD-10-CM

## 2020-02-28 DIAGNOSIS — B20 Human immunodeficiency virus [HIV] disease: Secondary | ICD-10-CM

## 2020-02-28 DIAGNOSIS — Z0000 Encounter for general adult medical examination without abnormal findings: Secondary | ICD-10-CM

## 2020-02-28 MED ORDER — BIKTARVY 50-200-25 MG PO TABS: ORAL_TABLET | ORAL | 4 refills | Status: DC

## 2020-02-28 MED ORDER — PREDNISONE 20 MG PO TABS: ORAL_TABLET | ORAL | 0 refills | Status: DC

## 2020-02-28 MED ORDER — HYDROCHLOROTHIAZIDE 12.5 MG PO CAPS: 12.5000 mg | ORAL_CAPSULE | Freq: Every day | ORAL | 2 refills | Status: DC

## 2020-02-28 MED ORDER — FAMOTIDINE 20 MG PO TABS
20.0000 mg | ORAL_TABLET | Freq: Two times a day (BID) | ORAL | 2 refills | Status: DC
Start: 2020-02-28 — End: 2020-11-27

## 2020-02-28 NOTE — Patient Instructions (Signed)
Nice to speak with you.  Continue to take your [place] daily as prescribed.  Start the prednisone and use the famotidine as needed for symptom relief.  Renew your financial assistance as scheduled below.  Plan for follow-up in 3 months or sooner if needed with lab work 1 to 2 weeks prior to appointment or on same day.  Have a great day and stay safe!

## 2020-02-28 NOTE — Assessment & Plan Note (Signed)
Allen [name] has signs/symptoms consistent with gastroesophageal reflux.  Discussed dietary options and will start famotidine.

## 2020-02-28 NOTE — Progress Notes (Signed)
Subjective:    Patient ID: Allen Krumholz., male    DOB: 09/10/1985, 34 y.o.   MRN: [id number]  Chief Complaint  Patient presents with  . Follow-up    B20. Patient requesting something fro heart burn. Patient states he has been taking Famotidine.      Virtual Visit via Telephone/Video Note   I connected with Allen [name]. on 02/28/2020 at 11:24 am by telephone and verified that I am speaking with the correct person using two identifiers.   I discussed the limitations, risks, security and privacy concerns of performing an evaluation and management service by telephone and the availability of in person appointments. I also discussed with the patient that there may be a patient responsible charge related to this service. The patient expressed understanding and agreed to proceed.  Location:  Patient: Home Provider: [facility]   HPI:  Allen Florek. is a 34 y.o. male with HIV disease last seen in the office on 10/29/2019 with good adherence and tolerance to his ART regimen of Biktarvy.  Viral load at the time was undetectable at 24 with CD4 count of 1234.  Most recent blood work completed on 01/15/2020 with a viral load that remains undetectable and CD4 count of 1403.  Virtual visit today for routine follow-up.  Allen [name] continues to take his Biktarvy daily as prescribed with no adverse side effects or missed doses.  Overall feeling well today although does have sinus congestion and heartburn. Denies fevers, chills, night sweats, headaches, changes in vision, neck pain/stiffness, nausea, diarrhea, vomiting, lesions or rashes.  Allen [name] has no problems obtaining his medication from the pharmacy and remains covered through [facility] [employer] which he will need to renew shortly and has an upcoming appointment.  Denies feelings of being down, depressed, or hopeless recently.  Continues to smoke marijuana on a regular basis with alcohol consumption socially and tobacco approximately 1/2  pack/day.  Working on cutting down tobacco use.   No Known Allergies    Outpatient Medications Prior to Visit  Medication Sig Dispense Refill  . albuterol (VENTOLIN HFA) 108 (90 Base) MCG/ACT inhaler Inhale 2 puffs into the lungs every 6 (six) hours as needed for wheezing or shortness of breath. 18 g 0  . cetirizine (ZYRTEC ALLERGY) 10 MG tablet Take 1 tablet (10 mg total) by mouth daily. 30 tablet 0  . hydrOXYzine (ATARAX/VISTARIL) 25 MG tablet Take 1 tablet (25 mg total) by mouth every 8 (eight) hours as needed. 30 tablet 0  . BIKTARVY 50-200-25 MG TABS tablet TK 1 T PO QD (Patient taking differently: Take 1 tablet by mouth daily. TK 1 T PO Q) 30 tablet 4  . lisinopril (ZESTRIL) 20 MG tablet TK 1 T PO D (Patient not taking: Reported on 12/18/2019) 30 tablet 0  . predniSONE (DELTASONE) 20 MG tablet Take 2 tablets daily with breakfast. 10 tablet 0   No facility-administered medications prior to visit.     Past Medical History:  Diagnosis Date  . Anxiety   . Fracture of metacarpal base of right hand, closed 11/2015   right small finger  . GERD (gastroesophageal reflux disease)    no current med.  Marland Kitchen HIV (human immunodeficiency virus infection) (HCC)   . Hypertension      Past Surgical History:  Procedure Laterality Date  . NO PAST SURGERIES    . OPEN REDUCTION INTERNAL FIXATION (ORIF) METACARPAL Right 12/09/2015   Procedure: OPEN REDUCTION INTERNAL FIXATION (ORIF) RIGHT SMALL FINGER/METACARPAL;  Surgeon: Caryn Bee  Merlyn Lot, MD;  Location: [facility];  Service: Orthopedics;  Laterality: Right;    Review of Systems  Constitutional: Negative for appetite change, chills, fatigue, fever and unexpected weight change.  Eyes: Negative for visual disturbance.  Respiratory: Negative for cough, chest tightness, shortness of breath and wheezing.   Cardiovascular: Negative for chest pain and leg swelling.  Gastrointestinal: Negative for abdominal pain, constipation, diarrhea, nausea  and vomiting.  Genitourinary: Negative for dysuria, flank pain, frequency, genital sores, hematuria and urgency.  Skin: Negative for rash.  Allergic/Immunologic: Negative for immunocompromised state.  Neurological: Negative for dizziness and headaches.      Objective:    Nursing note and vital signs reviewed.    Spoke with Allen [name] on the phone and he sounds to be doing well.  Physical exam limited secondary to virtual visit. Assessment & Plan:   Problem List Items Addressed This Visit      Digestive   Gastroesophageal reflux    Allen [name] has signs/symptoms consistent with gastroesophageal reflux.  Discussed dietary options and will start famotidine.      Relevant Medications   famotidine (PEPCID) 20 MG tablet     Other   Human immunodeficiency virus (HIV) disease (HCC)    Allen [name] continues to have well-controlled HIV disease with good adherence and tolerance to his ART regimen of Biktarvy.  No signs/symptoms of opportunistic infection or progressive HIV disease.  We reviewed previous lab work and discussed the plan of care.  We will need to renew financial assistance and has upcoming appointment.  Continue current dose of Biktarvy.  Plan for follow-up in 3 months or sooner if needed with lab work on the same day.      Relevant Medications   BIKTARVY 50-200-25 MG TABS tablet   Healthcare maintenance     Discussed/recommended Covid vaccination.  Discussed importance of safe sexual practice to reduce risk of STI.      Nasal congestion    Allen [name] has signs/symptoms consistent with rhinitis and possibly sinusitis.  Continue treat symptomatically.  Start prednisone and continue current dose of cetirizine as well as other over-the-counter medications as needed for symptom relief and supportive care.  Follow-up if symptoms worsen or do not improve.          I have discontinued Allen Belisle Jr.'s lisinopril. I am also having him start on hydrochlorothiazide  and famotidine. Additionally, I am having him maintain his albuterol, hydrOXYzine, cetirizine, predniSONE, and Biktarvy.   Meds ordered this encounter  Medications  . hydrochlorothiazide (MICROZIDE) 12.5 MG capsule    Sig: Take 1 capsule (12.5 mg total) by mouth daily.    Dispense:  30 capsule    Refill:  2    Order Specific Question:   Supervising Provider    Answer:   Judyann Munson [[id number]]  . famotidine (PEPCID) 20 MG tablet    Sig: Take 1 tablet (20 mg total) by mouth 2 (two) times daily.    Dispense:  30 tablet    Refill:  2    Order Specific Question:   Supervising Provider    Answer:   Judyann Munson [[id number]]  . predniSONE (DELTASONE) 20 MG tablet    Sig: Take 2 tablets daily with breakfast.    Dispense:  10 tablet    Refill:  0    Order Specific Question:   Supervising Provider    Answer:   Judyann Munson [[id number]]  . BIKTARVY 50-200-25 MG TABS tablet    Sig: TK  1 T PO QD    Dispense:  30 tablet    Refill:  4    Order Specific Question:   Supervising Provider    Answer:   Judyann Munson [phone]    I discussed the assessment and treatment plan with the patient. The patient was provided an opportunity to ask questions and all were answered. The patient agreed with the plan and demonstrated an understanding of the instructions.   The patient was advised to call back or seek an in-person evaluation if the symptoms worsen or if the condition fails to improve as anticipated.   I provided 12  minutes of non-face-to-face time during this encounter.  Follow-up: Return in about 3 months (around 05/30/2020), or if symptoms worsen or fail to improve.   Marcos Eke, MSN, FNP-C Nurse Practitioner [facility] Main number: [phone]

## 2020-02-28 NOTE — Addendum Note (Signed)
Addended by: Jeanine Luz D on: 02/28/2020 03:38 PM   Modules accepted: Orders

## 2020-02-28 NOTE — Assessment & Plan Note (Signed)
Allen [name] has signs/symptoms consistent with rhinitis and possibly sinusitis.  Continue treat symptomatically.  Start prednisone and continue current dose of cetirizine as well as other over-the-counter medications as needed for symptom relief and supportive care.  Follow-up if symptoms worsen or do not improve.

## 2020-02-28 NOTE — Assessment & Plan Note (Signed)
Allen [name] continues to have well-controlled HIV disease with good adherence and tolerance to his ART regimen of Biktarvy.  No signs/symptoms of opportunistic infection or progressive HIV disease.  We reviewed previous lab work and discussed the plan of care.  We will need to renew financial assistance and has upcoming appointment.  Continue current dose of Biktarvy.  Plan for follow-up in 3 months or sooner if needed with lab work on the same day.

## 2020-02-28 NOTE — Assessment & Plan Note (Signed)
   Discussed/recommended Covid vaccination.  Discussed importance of safe sexual practice to reduce risk of STI. 

## 2020-03-04 ENCOUNTER — Ambulatory Visit: Payer: Self-pay

## 2020-03-13 ENCOUNTER — Ambulatory Visit: Payer: Self-pay

## 2020-03-13 ENCOUNTER — Encounter: Payer: Self-pay | Admitting: Family

## 2020-03-13 ENCOUNTER — Other Ambulatory Visit: Payer: Self-pay

## 2020-04-28 ENCOUNTER — Ambulatory Visit (HOSPITAL_COMMUNITY): Payer: Self-pay

## 2020-05-26 ENCOUNTER — Other Ambulatory Visit: Payer: Self-pay

## 2020-05-27 ENCOUNTER — Other Ambulatory Visit: Payer: Self-pay

## 2020-05-28 ENCOUNTER — Telehealth: Payer: Self-pay | Admitting: *Deleted

## 2020-05-28 ENCOUNTER — Other Ambulatory Visit: Payer: Self-pay

## 2020-05-28 ENCOUNTER — Ambulatory Visit (INDEPENDENT_AMBULATORY_CARE_PROVIDER_SITE_OTHER): Payer: Self-pay

## 2020-05-28 DIAGNOSIS — B20 Human immunodeficiency virus [HIV] disease: Secondary | ICD-10-CM

## 2020-05-28 DIAGNOSIS — Z23 Encounter for immunization: Secondary | ICD-10-CM

## 2020-05-28 NOTE — Telephone Encounter (Signed)
Patient here for labs, asked for covid and flu shot and to speak with a nurse about medications.  RN gave patient his flu shot, explained that we can offer the covid shot on a Friday, or that he is able to get them at the pharmacy.    He is not taking famotidine for GERD, would like refills on that. He would also like to have a refill of the steroid he got before for sinus congestion.  He takes zyrtec 4x/week, benadryl a few nights a week, dayquil during the day.  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    He stopped the HCTZ, states it made him have to urinate too often while at work.  He is not taking anything for high blood pressure at this time, does not measure it.  RN advised patient that his provider would have to decide on these refills; will send to [place]. Andree Coss, RN

## 2020-05-29 LAB — T-HELPER CELL (CD4) - (RCID CLINIC ONLY)
CD4 % Helper T Cell: 55 % (ref 33–65)
CD4 T Cell Abs: 903 /uL (ref 400–1790)

## 2020-05-30 LAB — COMPREHENSIVE METABOLIC PANEL
AG Ratio: 2 (calc) (ref 1.0–2.5)
ALT: 20 U/L (ref 9–46)
AST: 21 U/L (ref 10–40)
Albumin: 4.7 g/dL (ref 3.6–5.1)
Alkaline phosphatase (APISO): 67 U/L (ref 36–130)
BUN/Creatinine Ratio: 8 (calc) (ref 6–22)
BUN: 11 mg/dL (ref 7–25)
CO2: 27 mmol/L (ref 20–32)
Calcium: 9.7 mg/dL (ref 8.6–10.3)
Chloride: 101 mmol/L (ref 98–110)
Creat: 1.37 mg/dL — ABNORMAL HIGH (ref 0.60–1.35)
Globulin: 2.3 g/dL (calc) (ref 1.9–3.7)
Glucose, Bld: 118 mg/dL — ABNORMAL HIGH (ref 65–99)
Potassium: 4.3 mmol/L (ref 3.5–5.3)
Sodium: 136 mmol/L (ref 135–146)
Total Bilirubin: 0.4 mg/dL (ref 0.2–1.2)
Total Protein: 7 g/dL (ref 6.1–8.1)

## 2020-05-30 LAB — HIV-1 RNA QUANT-NO REFLEX-BLD
HIV 1 RNA Quant: <20 Copies/mL — ABNORMAL HIGH
HIV-1 RNA Quant, Log: <1.3 Log cps/mL — ABNORMAL HIGH

## 2020-06-08 ENCOUNTER — Other Ambulatory Visit: Payer: Self-pay | Admitting: Family

## 2020-06-10 ENCOUNTER — Other Ambulatory Visit: Payer: Self-pay

## 2020-06-10 ENCOUNTER — Telehealth: Payer: Self-pay | Admitting: Family

## 2020-06-10 ENCOUNTER — Telehealth: Payer: Self-pay

## 2020-06-10 NOTE — Telephone Encounter (Signed)
Attempted to call patient twice for today's virtual appointment. No answer and unable to leave voicemail. Unable to complete today's appointment as scheduled.   Sandie Ano, RN

## 2020-06-28 ENCOUNTER — Encounter (HOSPITAL_COMMUNITY): Payer: Self-pay | Admitting: Emergency Medicine

## 2020-06-28 ENCOUNTER — Emergency Department (HOSPITAL_COMMUNITY): Payer: Self-pay

## 2020-06-28 ENCOUNTER — Other Ambulatory Visit: Payer: Self-pay

## 2020-06-28 ENCOUNTER — Emergency Department (HOSPITAL_COMMUNITY)
Admission: EM | Admit: 2020-06-28 | Discharge: 2020-06-28 | Disposition: A | Discharge status: Home / Self Care | Payer: Self-pay | Attending: Emergency Medicine | Admitting: Emergency Medicine

## 2020-06-28 DIAGNOSIS — Z20822 Contact with and (suspected) exposure to covid-19: Secondary | ICD-10-CM | POA: Insufficient documentation

## 2020-06-28 DIAGNOSIS — M545 Low back pain, unspecified: Secondary | ICD-10-CM | POA: Insufficient documentation

## 2020-06-28 DIAGNOSIS — B20 Human immunodeficiency virus [HIV] disease: Secondary | ICD-10-CM | POA: Insufficient documentation

## 2020-06-28 DIAGNOSIS — F17210 Nicotine dependence, cigarettes, uncomplicated: Secondary | ICD-10-CM | POA: Insufficient documentation

## 2020-06-28 DIAGNOSIS — Z79899 Other long term (current) drug therapy: Secondary | ICD-10-CM | POA: Insufficient documentation

## 2020-06-28 DIAGNOSIS — J4520 Mild intermittent asthma, uncomplicated: Secondary | ICD-10-CM | POA: Insufficient documentation

## 2020-06-28 DIAGNOSIS — I10 Essential (primary) hypertension: Secondary | ICD-10-CM | POA: Insufficient documentation

## 2020-06-28 LAB — CBC
HCT: 41.2 % (ref 39.0–52.0)
Hemoglobin: 13.9 g/dL (ref 13.0–17.0)
MCH: 27.7 pg (ref 26.0–34.0)
MCHC: 33.7 g/dL (ref 30.0–36.0)
MCV: 82.2 fL (ref 80.0–100.0)
Platelets: 315 10*3/uL (ref 150–400)
RBC: 5.01 MIL/uL (ref 4.22–5.81)
RDW: 13.9 % (ref 11.5–15.5)
WBC: 3.7 10*3/uL — ABNORMAL LOW (ref 4.0–10.5)
nRBC: 0 % (ref 0.0–0.2)

## 2020-06-28 LAB — TROPONIN I (HIGH SENSITIVITY)
Troponin I (High Sensitivity): 4 ng/L (ref <18)
Troponin I (High Sensitivity): 3 ng/L (ref <18)

## 2020-06-28 LAB — RESPIRATORY PANEL BY RT PCR (FLU A&B, COVID)
Influenza A by PCR: NEGATIVE
Influenza B by PCR: NEGATIVE
SARS Coronavirus 2 by RT PCR: NEGATIVE

## 2020-06-28 LAB — BASIC METABOLIC PANEL
Anion gap: 12 (ref 5–15)
BUN: 5 mg/dL — ABNORMAL LOW (ref 6–20)
CO2: 23 mmol/L (ref 22–32)
Calcium: 9.4 mg/dL (ref 8.9–10.3)
Chloride: 105 mmol/L (ref 98–111)
Creatinine, Ser: 1.03 mg/dL (ref 0.61–1.24)
GFR, Estimated: >60 mL/min (ref >60)
Glucose, Bld: 139 mg/dL — ABNORMAL HIGH (ref 70–99)
Potassium: 3.4 mmol/L — ABNORMAL LOW (ref 3.5–5.1)
Sodium: 140 mmol/L (ref 135–145)

## 2020-06-28 LAB — SALICYLATE LEVEL: Salicylate Lvl: <7 mg/dL — ABNORMAL LOW (ref 7.0–30.0)

## 2020-06-28 LAB — RAPID URINE DRUG SCREEN, HOSP PERFORMED
Amphetamines: NOT DETECTED
Barbiturates: NOT DETECTED
Benzodiazepines: NOT DETECTED
Cocaine: POSITIVE — AB
Opiates: NOT DETECTED
Tetrahydrocannabinol: POSITIVE — AB

## 2020-06-28 LAB — ACETAMINOPHEN LEVEL: Acetaminophen (Tylenol), Serum: <10 ug/mL — ABNORMAL LOW (ref 10–30)

## 2020-06-28 LAB — ETHANOL: Alcohol, Ethyl (B): 110 mg/dL — ABNORMAL HIGH (ref <10)

## 2020-06-28 MED ORDER — KETOROLAC TROMETHAMINE 30 MG/ML IJ SOLN
30.0000 mg | Freq: Once | INTRAMUSCULAR | Status: AC
  Administered 2020-06-28: 30 mg via INTRAMUSCULAR
  Filled 2020-06-28: qty 1

## 2020-06-28 MED ORDER — NAPROXEN 375 MG PO TABS: 375.0000 mg | ORAL_TABLET | Freq: Two times a day (BID) | ORAL | 0 refills | Status: DC

## 2020-06-28 MED ORDER — POTASSIUM CHLORIDE CRYS ER 20 MEQ PO TBCR
40.0000 meq | EXTENDED_RELEASE_TABLET | Freq: Once | ORAL | Status: AC
  Administered 2020-06-28: 40 meq via ORAL
  Filled 2020-06-28: qty 2

## 2020-06-28 MED ORDER — METHOCARBAMOL 500 MG PO TABS: 1000.0000 mg | ORAL_TABLET | Freq: Three times a day (TID) | ORAL | 0 refills | Status: DC | PRN

## 2020-06-28 NOTE — ED Provider Notes (Signed)
[facility] EMERGENCY DEPARTMENT Provider Note   CSN: [id number] Arrival date & time: 06/28/20  0232     History Chief Complaint  Patient presents with  . Chest Pain    Allen [name]. is a 34 y.o. male.  HPI 34 year old male presents with anxiety and depression in association with low back pain.  He states he has developed atraumatic low back pain for the last few days.  He is worried his medications may be causing this.  He has been on a stable course of meds for a year or so.  No fevers.  He chronically has some numbness to his legs but no new numbness or weakness.  No incontinence or fevers.  No abdominal pain.  He states because of his concerns he has been developing anxiety and he thinks this is what caused the chest pain that he transiently had when he came to the hospital.  He had shortness of breath that also was associated with this and went away.  He states his HIV numbers have been good.  He denies IV drug abuse but has been using cocaine to help with the pain.  Patient states that he is not suicidal.  He has been depressed because he does not have a local family around but he is not suicidal and definitely has no intentions of killing himself.   Past Medical History:  Diagnosis Date  . Anxiety   . Fracture of metacarpal base of right hand, closed 11/2015   right small finger  . GERD (gastroesophageal reflux disease)    no current med.  Marland Kitchen HIV (human immunodeficiency virus infection) (HCC)   . Hypertension     Patient Active Problem List   Diagnosis Date Noted  . Nasal congestion 02/28/2020  . Gastroesophageal reflux 02/28/2020  . Human immunodeficiency virus (HIV) disease (HCC) 03/20/2019  . Healthcare maintenance 03/20/2019  . Mild intermittent asthma without complication 03/20/2019  . Essential hypertension 03/20/2019  . Anxiety 03/20/2019    Past Surgical History:  Procedure Laterality Date  . NO PAST SURGERIES    . OPEN REDUCTION  INTERNAL FIXATION (ORIF) METACARPAL Right 12/09/2015   Procedure: OPEN REDUCTION INTERNAL FIXATION (ORIF) RIGHT SMALL FINGER/METACARPAL;  Surgeon: Betha Loa, MD;  Location: [facility];  Service: Orthopedics;  Laterality: Right;       Family History  Problem Relation Age of Onset  . Cancer Mother     Social History   Tobacco Use  . Smoking status: Current Every Day Smoker    Packs/day: 0.50    Years: 10.00    Pack years: 5.00    Types: Cigarettes  . Smokeless tobacco: Never Used  Vaping Use  . Vaping Use: Never used  Substance Use Topics  . Alcohol use: Yes    Comment: socially  . Drug use: Yes    Types: Marijuana, Cocaine    Home Medications Prior to Admission medications   Medication Sig Start Date End Date Taking? Authorizing Provider  albuterol (VENTOLIN HFA) 108 (90 Base) MCG/ACT inhaler Inhale 2 puffs into the lungs every 6 (six) hours as needed for wheezing or shortness of breath. 12/21/19   Wallis Bamberg, PA-C  BIKTARVY 50-200-25 MG TABS tablet TK 1 T PO QD 02/28/20   Veryl Speak, FNP  cetirizine (ZYRTEC ALLERGY) 10 MG tablet Take 1 tablet (10 mg total) by mouth daily. 12/21/19   Wallis Bamberg, PA-C  famotidine (PEPCID) 20 MG tablet Take 1 tablet (20 mg total) by mouth  2 (two) times daily. 02/28/20   Veryl Speakalone, Gregory D, FNP  hydrochlorothiazide (MICROZIDE) 12.5 MG capsule TAKE 1 CAPSULE(12.5 MG) BY MOUTH DAILY 06/09/20   Veryl Speakalone, Gregory D, FNP  hydrOXYzine (ATARAX/VISTARIL) 25 MG tablet Take 1 tablet (25 mg total) by mouth every 8 (eight) hours as needed. 12/21/19   Wallis BambergMani, Mario, PA-C  methocarbamol (ROBAXIN) 500 MG tablet Take 2 tablets (1,000 mg total) by mouth every 8 (eight) hours as needed for muscle spasms. 06/28/20   Pricilla LovelessGoldston, Endrit Gittins, MD  naproxen (NAPROSYN) 375 MG tablet Take 1 tablet (375 mg total) by mouth 2 (two) times daily with a meal. 06/28/20   Pricilla LovelessGoldston, Jenisse Vullo, MD  predniSONE (DELTASONE) 20 MG tablet Take 2 tablets daily with breakfast. 02/28/20    Veryl Speakalone, Gregory D, FNP    Allergies    Patient has no known allergies.  Review of Systems   Review of Systems  Constitutional: Negative for fever.  Respiratory: Positive for shortness of breath.   Cardiovascular: Positive for chest pain.  Gastrointestinal: Negative for abdominal pain.  Genitourinary:       No incontinence  Musculoskeletal: Positive for back pain.  Neurological: Negative for weakness.  Psychiatric/Behavioral: Positive for dysphoric mood. Negative for suicidal ideas. The patient is nervous/anxious.   All other systems reviewed and are negative.   Physical Exam Updated Vital Signs BP (!) 135/105 (BP Location: Left Arm)   Pulse 92   Temp 98.1 F (36.7 C) (Oral)   Resp 20   Ht 5\' 8"  (1.727 m)   Wt 97.5 kg   SpO2 100%   BMI 32.69 kg/m   Physical Exam Vitals and nursing note reviewed.  Constitutional:      General: He is not in acute distress.    Appearance: He is well-developed. He is not ill-appearing or diaphoretic.  HENT:     Head: Normocephalic and atraumatic.     Right Ear: External ear normal.     Left Ear: External ear normal.     Nose: Nose normal.  Eyes:     General:        Right eye: No discharge.        Left eye: No discharge.  Cardiovascular:     Rate and Rhythm: Normal rate and regular rhythm.     Heart sounds: Normal heart sounds.  Pulmonary:     Effort: Pulmonary effort is normal.     Breath sounds: Normal breath sounds.  Abdominal:     Palpations: Abdomen is soft.     Tenderness: There is no abdominal tenderness.  Musculoskeletal:     Cervical back: Neck supple.     Thoracic back: Tenderness present.     Lumbar back: Tenderness present.       Back:     Comments: Diffuse midline and paraspinal tenderness to lower thoracic and his lumbar back  Skin:    General: Skin is warm and dry.  Neurological:     Mental Status: He is alert.     Comments: 5/5 strength in BLE. Normal gross sensation  Psychiatric:        Mood and Affect:  Mood is anxious.        Thought Content: Thought content does not include suicidal ideation. Thought content does not include suicidal plan.     ED Results / Procedures / Treatments   Labs (all labs ordered are listed, but only abnormal results are displayed) Labs Reviewed  BASIC METABOLIC PANEL - Abnormal; Notable for the following components:  Result Value   Potassium 3.4 (*)    Glucose, Bld 139 (*)    BUN 5 (*)    All other components within normal limits  CBC - Abnormal; Notable for the following components:   WBC 3.7 (*)    All other components within normal limits  ETHANOL - Abnormal; Notable for the following components:   Alcohol, Ethyl (B) 110 (*)    All other components within normal limits  SALICYLATE LEVEL - Abnormal; Notable for the following components:   Salicylate Lvl <7.0 (*)    All other components within normal limits  ACETAMINOPHEN LEVEL - Abnormal; Notable for the following components:   Acetaminophen (Tylenol), Serum <10 (*)    All other components within normal limits  RAPID URINE DRUG SCREEN, HOSP PERFORMED - Abnormal; Notable for the following components:   Cocaine POSITIVE (*)    Tetrahydrocannabinol POSITIVE (*)    All other components within normal limits  RESPIRATORY PANEL BY RT PCR (FLU A&B, COVID)  TROPONIN I (HIGH SENSITIVITY)  TROPONIN I (HIGH SENSITIVITY)    EKG EKG Interpretation  Date/Time:  Saturday June 28 2020 02:41:06 EST Ventricular Rate:  116 PR Interval:  164 QRS Duration: 76 QT Interval:  316 QTC Calculation: 439 R Axis:   90 Text Interpretation: Sinus tachycardia Rightward axis Borderline ECG similar to May 2021 Confirmed by Pricilla Loveless [phone]) on 06/28/2020 7:19:01 AM   Radiology DG Chest 2 View  Result Date: 06/28/2020 CLINICAL DATA:  Chest pain. EXAM: CHEST - 2 VIEW COMPARISON:  Dec 18, 2019 FINDINGS: The heart size and mediastinal contours are within normal limits. Both lungs are clear. The visualized  skeletal structures are unremarkable. IMPRESSION: No active cardiopulmonary disease. Electronically Signed   By: Aram Candela M.D.   On: 06/28/2020 03:52    Procedures Procedures (including critical care time)  Medications Ordered in ED Medications  ketorolac (TORADOL) 30 MG/ML injection 30 mg (has no administration in time range)  potassium chloride SA (KLOR-CON) CR tablet 40 mEq (has no administration in time range)    ED Course  I have reviewed the triage vital signs and the nursing notes.  Pertinent labs & imaging results that were available during my care of the patient were reviewed by me and considered in my medical decision making (see chart for details).    MDM Rules/Calculators/A&P                          I had discussion with patient based on his back pain.  Given he has HIV he is at higher risk for significant spinal cord pathology.  However his HIV numbers have also been good including very low viral loads and good CD4 counts.  I discussed risk/benefit of MRI.  At this point he does not want to do MRI which I think is reasonable given no other red flags such as fever or neuro findings on exam.  However we discussed that if he is not improving with treatment at home, he needs to return for possible MRI.  As far as his chest pain, I think this was more likely anxiety related.  His labs and ECG have been reviewed.  Troponins negative x2.  While he was transiently tachycardic I think PE is unlikely.  Will discharge. Final Clinical Impression(s) / ED Diagnoses Final diagnoses:  Acute bilateral low back pain without sciatica    Rx / DC Orders ED Discharge Orders  Ordered    naproxen (NAPROSYN) 375 MG tablet  2 times daily with meals        06/28/20 0742    methocarbamol (ROBAXIN) 500 MG tablet  Every 8 hours PRN        06/28/20 3149           Pricilla Loveless, MD 06/28/20 [phone]

## 2020-06-28 NOTE — ED Triage Notes (Signed)
Pt presents to ED POV. Pt c/o CP, depression, and SI. Pt reports that pain began today when his mind was racing." pt denies HI

## 2020-06-28 NOTE — ED Notes (Signed)
Patient verbalizes understanding of discharge instructions. Opportunity for questioning and answers were provided. Armband removed by staff, pt discharged from ED.  

## 2020-06-28 NOTE — Discharge Instructions (Addendum)
If you develop worsening, recurrent, or continued back pain, numbness or weakness in the legs, incontinence of your bowels or bladders, numbness of your buttocks, fever, abdominal pain, or any other new/concerning symptoms then return to the ER for evaluation.   You are being prescribed Naproxen. Do not take Ibuprofen/Advil/Aleve/Motrin/Goody Powders/BC powders/Meloxicam/Diclofenac/Indomethacin and other Nonsteroidal anti-inflammatory medications   

## 2020-07-14 ENCOUNTER — Other Ambulatory Visit: Payer: Self-pay | Admitting: Family

## 2020-07-14 DIAGNOSIS — B20 Human immunodeficiency virus [HIV] disease: Secondary | ICD-10-CM

## 2020-08-13 ENCOUNTER — Telehealth: Payer: Self-pay

## 2020-08-13 ENCOUNTER — Other Ambulatory Visit: Payer: Self-pay | Admitting: Family

## 2020-08-13 DIAGNOSIS — B20 Human immunodeficiency virus [HIV] disease: Secondary | ICD-10-CM

## 2020-08-13 NOTE — Telephone Encounter (Signed)
Received refill request for Biktarvy. Patient overdue for follow-up appointment. RN sent in [place], attempted to call patient. No answer, unable to leave voicemail. Will send MyChart message.   Sandie Ano, RN

## 2020-08-14 ENCOUNTER — Other Ambulatory Visit: Payer: Self-pay

## 2020-08-18 ENCOUNTER — Other Ambulatory Visit: Payer: Self-pay

## 2020-08-18 DIAGNOSIS — Z113 Encounter for screening for infections with a predominantly sexual mode of transmission: Secondary | ICD-10-CM

## 2020-08-18 DIAGNOSIS — Z79899 Other long term (current) drug therapy: Secondary | ICD-10-CM

## 2020-08-18 DIAGNOSIS — B20 Human immunodeficiency virus [HIV] disease: Secondary | ICD-10-CM

## 2020-08-21 ENCOUNTER — Other Ambulatory Visit: Payer: Self-pay

## 2020-08-21 ENCOUNTER — Ambulatory Visit: Payer: Self-pay

## 2020-08-22 ENCOUNTER — Ambulatory Visit: Payer: Self-pay

## 2020-08-22 ENCOUNTER — Other Ambulatory Visit: Payer: Self-pay

## 2020-08-22 ENCOUNTER — Other Ambulatory Visit: Payer: Self-pay | Admitting: Family

## 2020-08-22 DIAGNOSIS — B20 Human immunodeficiency virus [HIV] disease: Secondary | ICD-10-CM

## 2020-08-28 ENCOUNTER — Other Ambulatory Visit: Payer: Self-pay

## 2020-08-28 ENCOUNTER — Ambulatory Visit: Payer: Self-pay

## 2020-08-28 DIAGNOSIS — Z79899 Other long term (current) drug therapy: Secondary | ICD-10-CM

## 2020-08-28 DIAGNOSIS — Z113 Encounter for screening for infections with a predominantly sexual mode of transmission: Secondary | ICD-10-CM

## 2020-08-28 DIAGNOSIS — B20 Human immunodeficiency virus [HIV] disease: Secondary | ICD-10-CM

## 2020-09-01 LAB — CBC WITH DIFFERENTIAL/PLATELET
Absolute Monocytes: 406 cells/uL (ref 200–950)
Basophils Absolute: 41 cells/uL (ref 0–200)
Basophils Relative: 1 %
Eosinophils Absolute: 111 cells/uL (ref 15–500)
Eosinophils Relative: 2.7 %
HCT: 43.3 % (ref 38.5–50.0)
Hemoglobin: 14.5 g/dL (ref 13.2–17.1)
Lymphs Abs: 1816 cells/uL (ref 850–3900)
MCH: 27 pg (ref 27.0–33.0)
MCHC: 33.5 g/dL (ref 32.0–36.0)
MCV: 80.6 fL (ref 80.0–100.0)
MPV: 10.1 fL (ref 7.5–12.5)
Monocytes Relative: 9.9 %
Neutro Abs: 1726 cells/uL (ref 1500–7800)
Neutrophils Relative %: 42.1 %
Platelets: 395 10*3/uL (ref 140–400)
RBC: 5.37 10*6/uL (ref 4.20–5.80)
RDW: 13.5 % (ref 11.0–15.0)
Total Lymphocyte: 44.3 %
WBC: 4.1 10*3/uL (ref 3.8–10.8)

## 2020-09-01 LAB — COMPLETE METABOLIC PANEL WITH GFR
AG Ratio: 1.9 (calc) (ref 1.0–2.5)
ALT: 28 U/L (ref 9–46)
AST: 17 U/L (ref 10–40)
Albumin: 4.8 g/dL (ref 3.6–5.1)
Alkaline phosphatase (APISO): 62 U/L (ref 36–130)
BUN: 8 mg/dL (ref 7–25)
CO2: 29 mmol/L (ref 20–32)
Calcium: 10 mg/dL (ref 8.6–10.3)
Chloride: 103 mmol/L (ref 98–110)
Creat: 1.13 mg/dL (ref 0.60–1.35)
GFR, Est African American: 98 mL/min/{1.73_m2} (ref >=60)
GFR, Est Non African American: 84 mL/min/{1.73_m2} (ref >=60)
Globulin: 2.5 g/dL (calc) (ref 1.9–3.7)
Glucose, Bld: 106 mg/dL — ABNORMAL HIGH (ref 65–99)
Potassium: 4.4 mmol/L (ref 3.5–5.3)
Sodium: 141 mmol/L (ref 135–146)
Total Bilirubin: 0.6 mg/dL (ref 0.2–1.2)
Total Protein: 7.3 g/dL (ref 6.1–8.1)

## 2020-09-01 LAB — LIPID PANEL
Cholesterol: 149 mg/dL (ref <200)
HDL: 45 mg/dL (ref >=40)
LDL Cholesterol (Calc): 84 mg/dL (calc)
Non-HDL Cholesterol (Calc): 104 mg/dL (calc) (ref <130)
Total CHOL/HDL Ratio: 3.3 (calc) (ref <5.0)
Triglycerides: 108 mg/dL (ref <150)

## 2020-09-01 LAB — FLUORESCENT TREPONEMAL AB(FTA)-IGG-BLD: Fluorescent Treponemal ABS: REACTIVE — AB

## 2020-09-01 LAB — RPR TITER: RPR Titer: 1:2 {titer} — ABNORMAL HIGH

## 2020-09-01 LAB — RPR: RPR Ser Ql: REACTIVE — AB

## 2020-09-01 LAB — T-HELPER CELLS (CD4) COUNT (NOT AT ARMC)
Absolute CD4: 1004 cells/uL (ref 490–1740)
CD4 T Helper %: 51 % (ref 30–61)
Total lymphocyte count: 1973 cells/uL (ref 850–3900)

## 2020-09-01 LAB — HIV-1 RNA QUANT-NO REFLEX-BLD
HIV 1 RNA Quant: <20 Copies/mL — ABNORMAL HIGH
HIV-1 RNA Quant, Log: <1.3 Log cps/mL — ABNORMAL HIGH

## 2020-09-08 ENCOUNTER — Ambulatory Visit: Payer: Self-pay | Admitting: Family

## 2020-09-09 ENCOUNTER — Other Ambulatory Visit: Payer: Self-pay | Admitting: Family

## 2020-09-10 ENCOUNTER — Telehealth: Payer: Self-pay

## 2020-09-10 ENCOUNTER — Other Ambulatory Visit: Payer: Self-pay | Admitting: Family

## 2020-09-10 DIAGNOSIS — B20 Human immunodeficiency virus [HIV] disease: Secondary | ICD-10-CM

## 2020-09-10 NOTE — Telephone Encounter (Signed)
Received refill request for patient's Biktarvy, he is overdue for follow-up. RN attempted to call patient to schedule appointment. Call could not be completed, unable to leave voicemail. Will send MyChart message.   Sandie Ano, RN

## 2020-10-06 ENCOUNTER — Other Ambulatory Visit: Payer: Self-pay

## 2020-10-06 ENCOUNTER — Ambulatory Visit (INDEPENDENT_AMBULATORY_CARE_PROVIDER_SITE_OTHER): Payer: Self-pay | Admitting: Family

## 2020-10-06 ENCOUNTER — Encounter: Payer: Self-pay | Admitting: Family

## 2020-10-06 VITALS — BP 132/93 | HR 102 | Temp 97.8°F | Ht 68.0 in | Wt 214.0 lb

## 2020-10-06 DIAGNOSIS — Z79899 Other long term (current) drug therapy: Secondary | ICD-10-CM

## 2020-10-06 DIAGNOSIS — I10 Essential (primary) hypertension: Secondary | ICD-10-CM

## 2020-10-06 DIAGNOSIS — Z113 Encounter for screening for infections with a predominantly sexual mode of transmission: Secondary | ICD-10-CM

## 2020-10-06 DIAGNOSIS — Z0000 Encounter for general adult medical examination without abnormal findings: Secondary | ICD-10-CM

## 2020-10-06 DIAGNOSIS — B20 Human immunodeficiency virus [HIV] disease: Secondary | ICD-10-CM

## 2020-10-06 DIAGNOSIS — F419 Anxiety disorder, unspecified: Secondary | ICD-10-CM

## 2020-10-06 DIAGNOSIS — R0981 Nasal congestion: Secondary | ICD-10-CM

## 2020-10-06 MED ORDER — HYDROXYZINE HCL 25 MG PO TABS: 25.0000 mg | ORAL_TABLET | Freq: Three times a day (TID) | ORAL | 2 refills | Status: DC | PRN

## 2020-10-06 MED ORDER — BIKTARVY 50-200-25 MG PO TABS: 1.0000 | ORAL_TABLET | Freq: Every day | ORAL | 5 refills | Status: DC

## 2020-10-06 MED ORDER — PREDNISONE 20 MG PO TABS: ORAL_TABLET | ORAL | 0 refills | Status: DC

## 2020-10-06 MED ORDER — HYDROCHLOROTHIAZIDE 12.5 MG PO CAPS: ORAL_CAPSULE | ORAL | 5 refills | Status: DC

## 2020-10-06 NOTE — Assessment & Plan Note (Signed)
Mr. Allen [name] continues to have waxing and waning anxiety which is adequately controlled with hydroxyzine.  No current symptoms.  Continue current dose of hydroxyzine.

## 2020-10-06 NOTE — Assessment & Plan Note (Signed)
   Discussed importance of safe sexual practice to reduce risk of STI.  Condoms declined  Declines vaccines.  Referral placed to [facility] dental clinic.

## 2020-10-06 NOTE — Assessment & Plan Note (Signed)
Blood pressure mildly elevated today above goal 140/90.  He is not on hydrochlorothiazide.  Denies any headaches or changes in vision.  No red flag/warning symptoms.  Continue current dose of hydrochlorothiazide.  Encouraged to watch dietary intake as his primary food sources fast food.  Continue to monitor blood pressure as able.

## 2020-10-06 NOTE — Assessment & Plan Note (Signed)
Mr. Marzetta Board continues to have well-controlled HIV disease with good adherence and tolerance to his ART regimen of Biktarvy.  No signs/symptoms of opportunistic infection or progressive HIV disease.  We reviewed lab work and discussed plan of care.  Continue current dose of Biktarvy.  Plan for follow-up in 4 months or sooner if needed with lab work 1 to 2 weeks prior to appointment.

## 2020-10-06 NOTE — Patient Instructions (Addendum)
Nice to see you.  We will continue your Biktarvy daily as prescribed.  Refills have been sent to the pharmacy.  We will get you set up with a dentistry referral  Please call [facility]) to schedule/follow up on your dental care at [phone] x [phone]  Plan for follow up in 4 months or sooner if needed with lab work 1-2 weeks prior to appointment.   Have a great day and stay safe!

## 2020-10-06 NOTE — Assessment & Plan Note (Signed)
Allen [name] is having nasal congestion that is unrelieved by over-the-counter medications.  Start prednisone.  Advised to follow-up if symptoms worsen or do not improve.

## 2020-10-06 NOTE — Progress Notes (Signed)
Subjective:    Patient ID: Allen Lia., male    DOB: [birth year], 35 y.o.   MRN: [id number]  Chief Complaint  Patient presents with  . Follow-up    Declined condoms      HPI:  Allen Kaus. is a 35 y.o. male with HIV disease last seen through telehealth on 02/28/2020 with well-controlled virus and good adherence and tolerance to his ART regimen of Biktarvy.  Most recent lab work completed on 08/28/2020 with viral load that was undetectable and CD4 count 1004.  Kidney function, liver function, electrolytes within normal ranges.  RPR positive at 1: 2 (previously treated with 1:16 in 2016).   Allen [name] continues to take his Biktarvy daily as prescribed with no adverse side effects or missed doses since his last office visit.  Overall feeling well today with concern for some pain in his wisdom teeth. Denies fevers, chills, night sweats, headaches, changes in vision, neck pain/stiffness, nausea, diarrhea, vomiting, lesions or rashes.  Allen [name] has no problems obtaining medication from the pharmacy and remains covered through [facility] which has been renewed.  Denies feelings of being down, depressed, or hopeless recently but has had increased levels of anxiety secondary to anniversaries of passing of several family members.  Continues to smoke marijuana daily, half a pack of cigarettes on average daily and alcohol socially.  Declines condoms.  Due for routine dental care.  Declines vaccines.   No Known Allergies    Outpatient Medications Prior to Visit  Medication Sig Dispense Refill  . albuterol (VENTOLIN HFA) 108 (90 Base) MCG/ACT inhaler Inhale 2 puffs into the lungs every 6 (six) hours as needed for wheezing or shortness of breath. 18 g 0  . cetirizine (ZYRTEC ALLERGY) 10 MG tablet Take 1 tablet (10 mg total) by mouth daily. 30 tablet 0  . famotidine (PEPCID) 20 MG tablet Take 1 tablet (20 mg total) by mouth 2 (two) times daily. 30 tablet 2  . naproxen (NAPROSYN) 375 MG  tablet Take 1 tablet (375 mg total) by mouth 2 (two) times daily with a meal. 20 tablet 0  . BIKTARVY 50-200-25 MG TABS tablet TAKE 1 TABLET BY MOUTH EVERY DAY 30 tablet 0  . hydrochlorothiazide (MICROZIDE) 12.5 MG capsule TAKE 1 CAPSULE(12.5 MG) BY MOUTH DAILY 30 capsule 2  . hydrOXYzine (ATARAX/VISTARIL) 25 MG tablet Take 1 tablet (25 mg total) by mouth every 8 (eight) hours as needed. 30 tablet 0  . methocarbamol (ROBAXIN) 500 MG tablet Take 2 tablets (1,000 mg total) by mouth every 8 (eight) hours as needed for muscle spasms. (Patient not taking: Reported on 10/06/2020) 15 tablet 0  . predniSONE (DELTASONE) 20 MG tablet Take 2 tablets daily with breakfast. (Patient not taking: Reported on 10/06/2020) 10 tablet 0   No facility-administered medications prior to visit.     Past Medical History:  Diagnosis Date  . Anxiety   . COVID   . Fracture of metacarpal base of right hand, closed 11/2015   right small finger  . GERD (gastroesophageal reflux disease)    no current med.  Marland Kitchen HIV (human immunodeficiency virus infection) (HCC)   . Hypertension      Past Surgical History:  Procedure Laterality Date  . NO PAST SURGERIES    . OPEN REDUCTION INTERNAL FIXATION (ORIF) METACARPAL Right 12/09/2015   Procedure: OPEN REDUCTION INTERNAL FIXATION (ORIF) RIGHT SMALL FINGER/METACARPAL;  Surgeon: Betha Loa, MD;  Location: [facility];  Service: Orthopedics;  Laterality: Right;  Review of Systems  Constitutional: Negative for appetite change, chills, fatigue, fever and unexpected weight change.  Eyes: Negative for visual disturbance.  Respiratory: Negative for cough, chest tightness, shortness of breath and wheezing.   Cardiovascular: Negative for chest pain and leg swelling.  Gastrointestinal: Negative for abdominal pain, constipation, diarrhea, nausea and vomiting.  Genitourinary: Negative for dysuria, flank pain, frequency, genital sores, hematuria and urgency.  Skin:  Negative for rash.  Allergic/Immunologic: Negative for immunocompromised state.  Neurological: Negative for dizziness and headaches.      Objective:    BP (!) 132/93   Pulse (!) 102   Temp 97.8 F (36.6 C) (Oral)   Ht 5\' 8"  (1.727 m)   Wt 214 lb (97.1 kg)   SpO2 100%   BMI 32.54 kg/m  Nursing note and vital signs reviewed.  Physical Exam Constitutional:      General: He is not in acute distress.    Appearance: He is well-developed.  HENT:     Mouth/Throat:     Mouth: Oropharynx is clear and moist.  Eyes:     Conjunctiva/sclera: Conjunctivae normal.  Cardiovascular:     Rate and Rhythm: Normal rate and regular rhythm.     Pulses: Intact distal pulses.     Heart sounds: Normal heart sounds. No murmur heard. No friction rub. No gallop.   Pulmonary:     Effort: Pulmonary effort is normal. No respiratory distress.     Breath sounds: Normal breath sounds. No wheezing or rales.  Chest:     Chest wall: No tenderness.  Abdominal:     General: Bowel sounds are normal.     Palpations: Abdomen is soft.     Tenderness: There is no abdominal tenderness.  Musculoskeletal:     Cervical back: Neck supple.  Lymphadenopathy:     Cervical: No cervical adenopathy.  Skin:    General: Skin is warm and dry.     Findings: No rash.  Neurological:     Mental Status: He is alert and oriented to person, place, and time.  Psychiatric:        Mood and Affect: Mood and affect normal.        Behavior: Behavior normal.        Thought Content: Thought content normal.        Judgment: Judgment normal.      Depression screen [facility] 2/9 10/06/2020 02/28/2020 03/20/2019  Decreased Interest 0 0 0  Down, Depressed, Hopeless 0 0 0  PHQ - 2 Score 0 0 0       Assessment & Plan:    Patient Active Problem List   Diagnosis Date Noted  . Nasal congestion 02/28/2020  . Gastroesophageal reflux 02/28/2020  . Human immunodeficiency virus (HIV) disease (HCC) 03/20/2019  . Healthcare maintenance  03/20/2019  . Mild intermittent asthma without complication 03/20/2019  . Essential hypertension 03/20/2019  . Anxiety 03/20/2019     Problem List Items Addressed This Visit      Cardiovascular and Mediastinum   Essential hypertension    Blood pressure mildly elevated today above goal 140/90.  He is not on hydrochlorothiazide.  Denies any headaches or changes in vision.  No red flag/warning symptoms.  Continue current dose of hydrochlorothiazide.  Encouraged to watch dietary intake as his primary food sources fast food.  Continue to monitor blood pressure as able.      Relevant Medications   hydrochlorothiazide (MICROZIDE) 12.5 MG capsule     Other   Human immunodeficiency virus (  HIV) disease [facility])    Allen [name] continues to have well-controlled HIV disease with good adherence and tolerance to his ART regimen of Biktarvy.  No signs/symptoms of opportunistic infection or progressive HIV disease.  We reviewed lab work and discussed plan of care.  Continue current dose of Biktarvy.  Plan for follow-up in 4 months or sooner if needed with lab work 1 to 2 weeks prior to appointment.      Relevant Medications   BIKTARVY 50-200-25 MG TABS tablet   Other Relevant Orders   HIV-1 RNA quant-no reflex-bld   T-helper cell (CD4)- ([facility] clinic only)   Comprehensive metabolic panel   Healthcare maintenance     Discussed importance of safe sexual practice to reduce risk of STI.  Condoms declined  Declines vaccines.  Referral placed to [facility] dental clinic.       Anxiety - Primary    Allen [name] continues to have waxing and waning anxiety which is adequately controlled with hydroxyzine.  No current symptoms.  Continue current dose of hydroxyzine.      Relevant Medications   hydrOXYzine (ATARAX/VISTARIL) 25 MG tablet   Nasal congestion    Allen [name] is having nasal congestion that is unrelieved by over-the-counter medications.  Start prednisone.  Advised to follow-up if symptoms worsen or do  not improve.      Relevant Medications   predniSONE (DELTASONE) 20 MG tablet    Other Visit Diagnoses    Screening for STDs (sexually transmitted diseases)       Relevant Orders   RPR   Pharmacologic therapy       Relevant Orders   Lipid panel       I have discontinued Allen Jefferson Jr.'s methocarbamol. I have also changed his Biktarvy. Additionally, I am having him maintain his albuterol, cetirizine, famotidine, naproxen, hydrOXYzine, hydrochlorothiazide, and predniSONE.   Meds ordered this encounter  Medications  . BIKTARVY 50-200-25 MG TABS tablet    Sig: Take 1 tablet by mouth daily.    Dispense:  30 tablet    Refill:  5    Order Specific Question:   Supervising Provider    Answer:   Judyann Munson [[id number]]  . hydrOXYzine (ATARAX/VISTARIL) 25 MG tablet    Sig: Take 1 tablet (25 mg total) by mouth every 8 (eight) hours as needed.    Dispense:  30 tablet    Refill:  2    Order Specific Question:   Supervising Provider    Answer:   Judyann Munson [[id number]]  . hydrochlorothiazide (MICROZIDE) 12.5 MG capsule    Sig: TAKE 1 CAPSULE(12.5 MG) BY MOUTH DAILY    Dispense:  30 capsule    Refill:  5    Order Specific Question:   Supervising Provider    Answer:   Judyann Munson [[id number]]  . predniSONE (DELTASONE) 20 MG tablet    Sig: Take 2 tablets daily with breakfast.    Dispense:  10 tablet    Refill:  0    Order Specific Question:   Supervising Provider    Answer:   Judyann Munson [[id number]]     Follow-up: Return in about 4 months (around 02/03/2021), or if symptoms worsen or fail to improve.   Allen Eke, MSN, FNP-C Nurse Practitioner [facility] Main number: [phone]

## 2020-10-18 ENCOUNTER — Encounter (HOSPITAL_COMMUNITY): Payer: Self-pay

## 2020-10-18 ENCOUNTER — Other Ambulatory Visit: Payer: Self-pay

## 2020-10-18 ENCOUNTER — Emergency Department (HOSPITAL_COMMUNITY)
Admission: EM | Admit: 2020-10-18 | Discharge: 2020-10-20 | Disposition: A | Discharge status: Other Acute Inpatient Hospital | Payer: Self-pay | Attending: Emergency Medicine | Admitting: Emergency Medicine

## 2020-10-18 DIAGNOSIS — I10 Essential (primary) hypertension: Secondary | ICD-10-CM | POA: Insufficient documentation

## 2020-10-18 DIAGNOSIS — Z21 Asymptomatic human immunodeficiency virus [HIV] infection status: Secondary | ICD-10-CM | POA: Insufficient documentation

## 2020-10-18 DIAGNOSIS — F17210 Nicotine dependence, cigarettes, uncomplicated: Secondary | ICD-10-CM | POA: Insufficient documentation

## 2020-10-18 DIAGNOSIS — T450X1A Poisoning by antiallergic and antiemetic drugs, accidental (unintentional), initial encounter: Secondary | ICD-10-CM | POA: Insufficient documentation

## 2020-10-18 DIAGNOSIS — R000 Tachycardia, unspecified: Secondary | ICD-10-CM | POA: Insufficient documentation

## 2020-10-18 DIAGNOSIS — F411 Generalized anxiety disorder: Secondary | ICD-10-CM | POA: Insufficient documentation

## 2020-10-18 DIAGNOSIS — F332 Major depressive disorder, recurrent severe without psychotic features: Secondary | ICD-10-CM | POA: Insufficient documentation

## 2020-10-18 DIAGNOSIS — X58XXXA Exposure to other specified factors, initial encounter: Secondary | ICD-10-CM | POA: Insufficient documentation

## 2020-10-18 DIAGNOSIS — Z8616 Personal history of COVID-19: Secondary | ICD-10-CM | POA: Insufficient documentation

## 2020-10-18 DIAGNOSIS — Z20822 Contact with and (suspected) exposure to covid-19: Secondary | ICD-10-CM | POA: Insufficient documentation

## 2020-10-18 DIAGNOSIS — T1491XA Suicide attempt, initial encounter: Secondary | ICD-10-CM

## 2020-10-18 DIAGNOSIS — F1490 Cocaine use, unspecified, uncomplicated: Secondary | ICD-10-CM | POA: Insufficient documentation

## 2020-10-18 LAB — COMPREHENSIVE METABOLIC PANEL
ALT: 28 U/L (ref 0–44)
AST: 24 U/L (ref 15–41)
Albumin: 4.7 g/dL (ref 3.5–5.0)
Alkaline Phosphatase: 56 U/L (ref 38–126)
Anion gap: 15 (ref 5–15)
BUN: 9 mg/dL (ref 6–20)
CO2: 22 mmol/L (ref 22–32)
Calcium: 9.6 mg/dL (ref 8.9–10.3)
Chloride: 99 mmol/L (ref 98–111)
Creatinine, Ser: 1.04 mg/dL (ref 0.61–1.24)
GFR, Estimated: >60 mL/min (ref >60)
Glucose, Bld: 108 mg/dL — ABNORMAL HIGH (ref 70–99)
Potassium: 3.5 mmol/L (ref 3.5–5.1)
Sodium: 136 mmol/L (ref 135–145)
Total Bilirubin: 0.8 mg/dL (ref 0.3–1.2)
Total Protein: 8.1 g/dL (ref 6.5–8.1)

## 2020-10-18 LAB — CBC WITH DIFFERENTIAL/PLATELET
Abs Immature Granulocytes: 0.02 10*3/uL (ref 0.00–0.07)
Basophils Absolute: 0 10*3/uL (ref 0.0–0.1)
Basophils Relative: 1 %
Eosinophils Absolute: 0 10*3/uL (ref 0.0–0.5)
Eosinophils Relative: 1 %
HCT: 40.9 % (ref 39.0–52.0)
Hemoglobin: 13.7 g/dL (ref 13.0–17.0)
Immature Granulocytes: 0 %
Lymphocytes Relative: 34 %
Lymphs Abs: 1.8 10*3/uL (ref 0.7–4.0)
MCH: 27.3 pg (ref 26.0–34.0)
MCHC: 33.5 g/dL (ref 30.0–36.0)
MCV: 81.5 fL (ref 80.0–100.0)
Monocytes Absolute: 0.4 10*3/uL (ref 0.1–1.0)
Monocytes Relative: 7 %
Neutro Abs: 3.1 10*3/uL (ref 1.7–7.7)
Neutrophils Relative %: 57 %
Platelets: 315 10*3/uL (ref 150–400)
RBC: 5.02 MIL/uL (ref 4.22–5.81)
RDW: 14.6 % (ref 11.5–15.5)
WBC: 5.4 10*3/uL (ref 4.0–10.5)
nRBC: 0 % (ref 0.0–0.2)

## 2020-10-18 LAB — SALICYLATE LEVEL: Salicylate Lvl: <7 mg/dL — ABNORMAL LOW (ref 7.0–30.0)

## 2020-10-18 LAB — ACETAMINOPHEN LEVEL
Acetaminophen (Tylenol), Serum: <10 ug/mL — ABNORMAL LOW (ref 10–30)
Acetaminophen (Tylenol), Serum: <10 ug/mL — ABNORMAL LOW (ref 10–30)

## 2020-10-18 LAB — RAPID URINE DRUG SCREEN, HOSP PERFORMED
Amphetamines: POSITIVE — AB
Barbiturates: NOT DETECTED
Benzodiazepines: NOT DETECTED
Cocaine: POSITIVE — AB
Opiates: NOT DETECTED
Tetrahydrocannabinol: POSITIVE — AB

## 2020-10-18 LAB — RESP PANEL BY RT-PCR (FLU A&B, COVID) ARPGX2
Influenza A by PCR: NEGATIVE
Influenza B by PCR: NEGATIVE
SARS Coronavirus 2 by RT PCR: NEGATIVE

## 2020-10-18 LAB — MAGNESIUM: Magnesium: 2 mg/dL (ref 1.7–2.4)

## 2020-10-18 LAB — ETHANOL: Alcohol, Ethyl (B): 109 mg/dL — ABNORMAL HIGH (ref <10)

## 2020-10-18 MED ORDER — FAMOTIDINE 20 MG PO TABS
20.0000 mg | ORAL_TABLET | Freq: Two times a day (BID) | ORAL | Status: DC
  Administered 2020-10-18 – 2020-10-19 (×4): 20 mg via ORAL
  Filled 2020-10-18 (×4): qty 1

## 2020-10-18 MED ORDER — SODIUM CHLORIDE 0.9 % IV SOLN: INTRAVENOUS | Status: DC

## 2020-10-18 MED ORDER — SODIUM CHLORIDE 0.9 % IV BOLUS
1000.0000 mL | Freq: Once | INTRAVENOUS | Status: AC
  Administered 2020-10-18: 1000 mL via INTRAVENOUS

## 2020-10-18 MED ORDER — BICTEGRAVIR-EMTRICITAB-TENOFOV 50-200-25 MG PO TABS
1.0000 | ORAL_TABLET | Freq: Every day | ORAL | Status: DC
  Administered 2020-10-18 – 2020-10-19 (×2): 1 via ORAL
  Filled 2020-10-18 (×2): qty 1

## 2020-10-18 MED ORDER — ALBUTEROL SULFATE HFA 108 (90 BASE) MCG/ACT IN AERS: 2.0000 | INHALATION_SPRAY | Freq: Four times a day (QID) | RESPIRATORY_TRACT | Status: DC | PRN

## 2020-10-18 MED ORDER — SODIUM CHLORIDE 0.9 % IV BOLUS: 1000.0000 mL | Freq: Once | INTRAVENOUS | Status: DC

## 2020-10-18 NOTE — ED Provider Notes (Signed)
Patient has been medically cleared.  Assessed by psychiatry.  Plan for inpt treatment   Linwood Dibbles, MD 10/18/20 1343

## 2020-10-18 NOTE — ED Notes (Signed)
Pt awake, requesting urinal, pt alert and oriented at this time. He admits he was planning to harm self. Now that he is awake he states the intention is no longer there. He presently denies the SI. Requesting to know when TTS will be done.

## 2020-10-18 NOTE — BH Assessment (Addendum)
Comprehensive Clinical Assessment (CCA) Note  10/18/2020 Allen [name]. [id number]   Disposition: Allen [name] recommends a inpatient admission to assist with stabilization. IVC was initiated on arrival by EDP.  Flowsheet Row ED from 10/18/2020 in [facility]  C-SSRS RISK CATEGORY High Risk     The patient demonstrates the following risk factors for suicide: Chronic risk factors for suicide include: N/A. Acute risk factors for suicide include: social withdrawal/isolation. Protective factors for this patient include: positive social support. Considering these factors, the overall suicide risk at this point appears to be high. Patient is appropriate for outpatient follow up.  Patient presents with ongoing thoughts of self harm reporting he ingested 10 25 mg Hydroxyzine tablets in a attempt to end his life prior to arrival. Patient denies any H/I or AVH. Patient reports he was diagnosed with depression by his PCP (patient cannot recall provider's name) and states he was prescribed vistaril 25 mg for symptoms associated with anxiety/depression. Patient renders limited history and is observed to be guarded. Patient states he "just has problems with everything" when asked in reference to why he attempted self harm. Patient does report a previous attempt in the past although states "that was in the past." Patient states he currently resides alone and reports family contact is a stressor although will not elaborate on content of statement. Patient reports a SA history to include sporadic alcohol and cocaine use stating he "used a little last night and had a couple drinks" again patient renders limited history in reference to patterns of use or time frame.   Per notes on arrival EDP writes: Allen [name]. is a 35 y.o. male here for suicide attempt by overdosing on approximately 10 pills of 25 mg hydroxyzine and a few shots of alcohol. He denies any other illicit drug use.  Patient reports that he did this in an attempt at self-harm stating that he wanted to go to sleep and never wake up. When asked if he was attempted to commit suicide he said "if that is what you called it." He reports that he is dealing with some issues with family and is too much for him to deal with. He denies any HI or AVH. Reports previous attempts at suicide.  Patient is observed to be drowsy and renders limited history. Patient is oriented x4 and speaks in a low soft voice that is difficult to understand at times. Patient's memory appears to be intact although thoughts somewhat disorganized. Patient does not appear to be responding to internal stimuli.      Chief Complaint:  Chief Complaint  Patient presents with  . Drug Overdose   Visit Diagnosis: MDD recurrent severe, GAD, Cocaine use     CCA Screening, Triage and Referral (STR)  Patient Reported Information How did you hear about [place]? Self  Referral name: No data recorded Referral phone number: No data recorded  Whom do you see for routine medical problems? I don't have a doctor  Practice/Facility Name: No data recorded Practice/Facility Phone Number: No data recorded Name of Contact: No data recorded Contact Number: No data recorded Contact Fax Number: No data recorded Prescriber Name: No data recorded Prescriber Address (if known): No data recorded  What Is the Reason for Your Visit/Call Today? S/I with overdose  How Long Has This Been Causing You Problems? <Week  What Do You Feel Would Help You the Most Today? Alcohol or Drug Use Treatment   Have You Recently Been in Any Inpatient Treatment (  Hospital/Detox/Crisis Center/28-Day Program)? No  Name/Location of Program/Hospital:No data recorded How Long Were You There? No data recorded When Were You Discharged? No data recorded  [facility] You [facility]? No  Who Do You [facility] at [facility]? No data recorded  Have You Recently Had  Any Thoughts About Hurting Yourself? Yes  Are You Planning to Commit Suicide/Harm Yourself At This time? Yes   Have you Recently Had Thoughts About Hurting Someone Karolee Ohslse? No  Explanation: No data recorded  Have You Used Any Alcohol or Drugs in the Past 24 Hours? Yes  How Long Ago Did You Use Drugs or Alcohol? No data recorded What Did You Use and How Much? Cocaine unknown amount   Do You Currently Have a Therapist/Psychiatrist? No  Name of Therapist/Psychiatrist: No data recorded  Have You Been Recently Discharged From Any Office Practice or Programs? No  Explanation of Discharge From Practice/Program: No data recorded    CCA Screening Triage Referral Assessment Type of Contact: Face-to-Face  Is this Initial or Reassessment? No data recorded Date Telepsych consult ordered in [facility]:  No data recorded Time Telepsych consult ordered in [facility]:  No data recorded  Patient Reported Information Reviewed? Yes  Patient Left Without Being Seen? No data recorded Reason for Not Completing Assessment: No data recorded  Collateral Involvement: No data recorded  Does Patient Have a Court Appointed Legal Guardian? No data recorded Name and Contact of Legal Guardian: No data recorded If Minor and Not Living with Parent(s), Who has Custody? No data recorded Is CPS involved or ever been involved? Never  Is APS involved or ever been involved? Never   Patient Determined To Be At Risk for Harm To Self or Others Based on Review of Patient Reported Information or Presenting Complaint? Yes, for Self-Harm  Method: No data recorded Availability of Means: No data recorded Intent: No data recorded Notification Required: No data recorded Additional Information for Danger to Others Potential: No data recorded Additional Comments for Danger to Others Potential: No data recorded Are There Guns or Other Weapons in Your Home? No data recorded Types of Guns/Weapons: No data recorded Are These Weapons  Safely Secured?                            No data recorded Who Could Verify You Are Able To Have These Secured: No data recorded Do You Have any Outstanding Charges, Pending Court Dates, Parole/Probation? No data recorded Contacted To Inform of Risk of Harm To Self or Others: Other: Comment (NA)   Location of Assessment: [facility]   Does Patient Present under Involuntary Commitment? Yes  IVC Papers Initial File Date: 10/18/2020   [place]   Patient Currently Receiving the Following Services: Not Receiving Services   Determination of Need: -- (To be determined)   Options For Referral: Outpatient Therapy     CCA Biopsychosocial Intake/Chief Complaint:  Ongoing S/I pt overdosed earlier this date  Current Symptoms/Problems: Depression and anxiety   Patient Reported Schizophrenia/Schizoaffective Diagnosis in Past: No   Strengths: No data recorded Preferences: No data recorded Abilities: No data recorded  Type of Services Patient Feels are Needed: No data recorded  Initial Clinical Notes/Concerns: No data recorded  Mental Health Symptoms Depression:  Change in energy/activity   Duration of Depressive symptoms: Less than two weeks   Mania:  None   Anxiety:   Difficulty concentrating   Psychosis:  None  Duration of Psychotic symptoms: No data recorded  Trauma:  None   Obsessions:  None   Compulsions:  None   Inattention:  None   Hyperactivity/Impulsivity:  N/A   Oppositional/Defiant Behaviors:  None   Emotional Irregularity:  Chronic feelings of emptiness   Other Mood/Personality Symptoms:  No data recorded   Mental Status Exam Appearance and self-care  Stature:  Average   Weight:  Average weight   Clothing:  Neat/clean   Grooming:  Normal   Cosmetic use:  None   Posture/gait:  Normal   Motor activity:  Not Remarkable   Sensorium  Attention:  Normal   Concentration:  Normal   Orientation:  X5   Recall/memory:   Normal   Affect and Mood  Affect:  Anxious   Mood:  Depressed   Relating  Eye contact:  Normal   Facial expression:  Depressed   Attitude toward examiner:  Cooperative   Thought and Language  Speech flow: Normal   Thought content:  Appropriate to Mood and Circumstances   Preoccupation:  None   Hallucinations:  None   Organization:  No data recorded  [employer] of Knowledge:  Fair   Intelligence:  Average   Abstraction:  Normal   Judgement:  Normal   Reality Testing:  Realistic   Insight:  Fair   Decision Making:  Normal   Social Functioning  Social Maturity:  Responsible   Social Judgement:  Normal   Stress  Stressors:  Family conflict   Coping Ability:  Normal   Skill Deficits:  None   Supports:  Usual     Religion: Religion/Spirituality Are You A Religious Person?: No  Leisure/Recreation: Leisure / Recreation Do You Have Hobbies?: No  Exercise/Diet: Exercise/Diet Do You Exercise?: No Have You Gained or Lost A Significant Amount of Weight in the Past Six Months?: No Do You Follow a Special Diet?: No Do You Have Any Trouble Sleeping?: No   CCA Employment/Education Employment/Work Situation: Employment / Work Situation Employment situation: Employed  Education:     CCA Family/Childhood History Family and Relationship History: Family history Marital status: Single Does patient have children?: No  Childhood History:  Childhood History Did patient suffer any verbal/emotional/physical/sexual abuse as a child?: No Did patient suffer from severe childhood neglect?: No Has patient ever been sexually abused/assaulted/raped as an adolescent or adult?: No Was the patient ever a victim of a crime or a disaster?: No Witnessed domestic violence?: No Has patient been affected by domestic violence as an adult?: No  Child/Adolescent Assessment:     CCA Substance Use Alcohol/Drug Use: Alcohol / Drug Use Pain  Medications: See MAR Prescriptions: See MAR Over the Counter: See MAR History of alcohol / drug use?: Yes Longest period of sobriety (when/how long): Unknown Negative Consequences of Use: Personal relationships Withdrawal Symptoms:  (NA) Substance #1 Name of Substance 1: Cocaine 1 - Age of First Use: 18 1 - Amount (size/oz): Varies 1 - Frequency: Varies 1 - Duration: Ongoing 1 - Last Use / Amount: 10/17/20 1 gram 1- Route of Use: Nasal                       ASAM's:  Six Dimensions of Multidimensional Assessment  Dimension 1:  Acute Intoxication and/or Withdrawal Potential:   Dimension 1:  Description of individual's past and current experiences of substance use and withdrawal: 2  Dimension 2:  Biomedical Conditions and Complications:   Dimension 2:  Description of  patient's biomedical conditions and  complications: 2  Dimension 3:  Emotional, Behavioral, or Cognitive Conditions and Complications:  Dimension 3:  Description of emotional, behavioral, or cognitive conditions and complications: 2  Dimension 4:  Readiness to Change:  Dimension 4:  Description of Readiness to Change criteria: 1  Dimension 5:  Relapse, Continued use, or Continued Problem Potential:  Dimension 5:  Relapse, continued use, or continued problem potential critiera description: 2  Dimension 6:  Recovery/Living Environment:  Dimension 6:  Recovery/Iiving environment criteria description: 1  ASAM Severity Score: ASAM's Severity Rating Score: 10  ASAM Recommended Level of Treatment:     Substance use Disorder (SUD) Substance Use Disorder (SUD)  Checklist Symptoms of Substance Use: Continued use despite having a persistent/recurrent physical/psychological problem caused/exacerbated by use  Recommendations for Services/Supports/Treatments: Recommendations for Services/Supports/Treatments Recommendations For Services/Supports/Treatments: Peer Support  DSM5 Diagnoses: Patient Active Problem List    Diagnosis Date Noted  . Nasal congestion 02/28/2020  . Gastroesophageal reflux 02/28/2020  . Human immunodeficiency virus (HIV) disease (HCC) 03/20/2019  . Healthcare maintenance 03/20/2019  . Mild intermittent asthma without complication 03/20/2019  . Essential hypertension 03/20/2019  . Anxiety 03/20/2019    Patient Centered [name]: Patient is on the following Treatment [name](s):     Referrals to Alternative Service(s): Referred to Alternative Service(s):   Place:   Date:   Time:    Referred to Alternative Service(s):   Place:   Date:   Time:    Referred to Alternative Service(s):   Place:   Date:   Time:    Referred to Alternative Service(s):   Place:   Date:   Time:     Alfredia Ferguson, LCAS

## 2020-10-18 NOTE — ED Notes (Addendum)
PT HAS ONE BAG OF BELONGINGS IN CABINET 9-12 HALL B. IT INCLUDES CELL PHONE, RED /BALCK SLIPPERS, BOXERS, SHIRT.

## 2020-10-18 NOTE — ED Notes (Signed)
Pt provided a sandwich and 2 cups of juice. He is upset over the IVC event.

## 2020-10-18 NOTE — ED Notes (Signed)
Spoke with Almira Coaster at poison control, they recommend the following: 1) 6 hr obs 2) Fluids and benzos as needed. 3) Repeat tylenol level 4 hours post ingestion. 4) Mag level

## 2020-10-18 NOTE — ED Notes (Signed)
DELAY ON STARTING SECOND BAG OF NS BOLUS AS FIRST WAS NOT INFUSING.

## 2020-10-18 NOTE — ED Notes (Signed)
pts HR is around 100 until a phone call in which he is crying and upset. Continue to observe and monitor.

## 2020-10-18 NOTE — ED Notes (Signed)
Pt responds to commands and will answer questions although remains sedated due to ingestion.

## 2020-10-18 NOTE — ED Provider Notes (Signed)
[facility] Provider Note  CSN: [id number] Arrival date & time: 10/18/20 [id number]  Chief Complaint(s) Drug Overdose ED Triage Notes [name], Allen Evenerabitha N, RN (Registered Nurse) . Marland Kitchen. Emergency Medicine . Marland Kitchen. Date of Service: 10/18/2020 5:51 AM . . Signed   Pt called friend expressing desire to self harm.  EMS responded to welfare check. Pt reports taking (10) 25mg  hydroxyzine pills and consuming alcohol.      HPI Allen PlanVictor Oien Jr. is a 35 y.o. male here for suicide attempt by overdosing on approximately 10 pills of 25 mg hydroxyzine and a few shots of alcohol.  He denies any other illicit drug use.  Patient reports that he did this in an attempt at self-harm stating that he wanted to go to sleep and never wake up.  When asked if he was attempted to commit suicide he said "if that is what you called it."  He reports that he is dealing with some issues with family and is too much for him to deal with.  He denies any HI or AVH.  Reports previous attempts at suicide.  Of note patient has a history of HIV on antiretrovirals medications with normal CD4 counts and undetectable HIV quant in January of this year.  HPI  Past Medical History Past Medical History:  Diagnosis Date  . Anxiety   . COVID   . Fracture of metacarpal base of right hand, closed 11/2015   right small finger  . GERD (gastroesophageal reflux disease)    no current med.  Marland Kitchen. HIV (human immunodeficiency virus infection) (HCC)   . Hypertension    Patient Active Problem List   Diagnosis Date Noted  . Nasal congestion 02/28/2020  . Gastroesophageal reflux 02/28/2020  . Human immunodeficiency virus (HIV) disease (HCC) 03/20/2019  . Healthcare maintenance 03/20/2019  . Mild intermittent asthma without complication 03/20/2019  . Essential hypertension 03/20/2019  . Anxiety 03/20/2019   Home Medication(s) Prior to Admission medications   Medication Sig Start Date End Date Taking? Authorizing  Provider  albuterol (VENTOLIN HFA) 108 (90 Base) MCG/ACT inhaler Inhale 2 puffs into the lungs every 6 (six) hours as needed for wheezing or shortness of breath. 12/21/19   Wallis BambergMani, Mario, PA-C  BIKTARVY 50-200-25 MG TABS tablet Take 1 tablet by mouth daily. 10/06/20   Veryl Speakalone, Gregory D, FNP  cetirizine (ZYRTEC ALLERGY) 10 MG tablet Take 1 tablet (10 mg total) by mouth daily. 12/21/19   Wallis BambergMani, Mario, PA-C  famotidine (PEPCID) 20 MG tablet Take 1 tablet (20 mg total) by mouth 2 (two) times daily. 02/28/20   Veryl Speakalone, Gregory D, FNP  hydrochlorothiazide (MICROZIDE) 12.5 MG capsule TAKE 1 CAPSULE(12.5 MG) BY MOUTH DAILY 10/06/20   Veryl Speakalone, Gregory D, FNP  hydrOXYzine (ATARAX/VISTARIL) 25 MG tablet Take 1 tablet (25 mg total) by mouth every 8 (eight) hours as needed. 10/06/20   Veryl Speakalone, Gregory D, FNP  naproxen (NAPROSYN) 375 MG tablet Take 1 tablet (375 mg total) by mouth 2 (two) times daily with a meal. 06/28/20   Pricilla LovelessGoldston, Scott, MD  predniSONE (DELTASONE) 20 MG tablet Take 2 tablets daily with breakfast. 10/06/20   Veryl Speakalone, Gregory D, FNP  Past Surgical History Past Surgical History:  Procedure Laterality Date  . NO PAST SURGERIES    . OPEN REDUCTION INTERNAL FIXATION (ORIF) METACARPAL Right 12/09/2015   Procedure: OPEN REDUCTION INTERNAL FIXATION (ORIF) RIGHT SMALL FINGER/METACARPAL;  Surgeon: Betha Loa, MD;  Location: [facility];  Service: Orthopedics;  Laterality: Right;   Family History Family History  Problem Relation Age of Onset  . Cancer Mother     Social History Social History   Tobacco Use  . Smoking status: Current Every Day Smoker    Packs/day: 0.50    Years: 10.00    Pack years: 5.00    Types: Cigarettes  . Smokeless tobacco: Never Used  Vaping Use  . Vaping Use: Never used  Substance Use Topics  . Alcohol use: Yes    Alcohol/week: 1.0  standard drink    Types: 1 Cans of beer per week    Comment: socially  . Drug use: Yes    Types: Marijuana, Cocaine    Comment: marijuana daily    Allergies Patient has no known allergies.  Review of Systems Review of Systems All other systems are reviewed and are negative for acute change except as noted in the HPI  Physical Exam Vital Signs  I have reviewed the triage vital signs BP 116/70 (BP Location: Right Arm)   Pulse (!) 119   Temp 98 F (36.7 C) (Oral)   Ht 5\' 8"  (1.727 m)   Wt 97.5 kg   SpO2 98%   BMI 32.69 kg/m   Physical Exam Vitals reviewed.  Constitutional:      General: He is not in acute distress.    Appearance: He is well-developed. He is not diaphoretic.  HENT:     Head: Normocephalic and atraumatic.     Jaw: No trismus.     Right Ear: External ear normal.     Left Ear: External ear normal.     Nose: Nose normal.  Eyes:     General: No scleral icterus.    Conjunctiva/sclera: Conjunctivae normal.  Neck:     Trachea: Phonation normal.  Cardiovascular:     Rate and Rhythm: Regular rhythm. Tachycardia present.  Pulmonary:     Effort: Pulmonary effort is normal. No respiratory distress.     Breath sounds: No stridor.  Abdominal:     General: There is no distension.  Musculoskeletal:        General: Normal range of motion.     Cervical back: Normal range of motion.  Neurological:     Mental Status: He is alert and oriented to person, place, and time.  Psychiatric:        Behavior: Behavior normal.     ED Results and Treatments Labs (all labs ordered are listed, but only abnormal results are displayed) Labs Reviewed  COMPREHENSIVE METABOLIC PANEL - Abnormal; Notable for the following components:      Result Value   Glucose, Bld 108 (*)    All other components within normal limits  SALICYLATE LEVEL - Abnormal; Notable for the following components:   Salicylate Lvl <7.0 (*)    All other components within normal limits  ACETAMINOPHEN LEVEL  - Abnormal; Notable for the following components:   Acetaminophen (Tylenol), Serum <10 (*)    All other components within normal limits  ETHANOL - Abnormal; Notable for the following components:   Alcohol, Ethyl (B) 109 (*)    All other components within normal limits  RAPID URINE DRUG SCREEN, HOSP PERFORMED -  Abnormal; Notable for the following components:   Cocaine POSITIVE (*)    Amphetamines POSITIVE (*)    Tetrahydrocannabinol POSITIVE (*)    All other components within normal limits  CBC WITH DIFFERENTIAL/PLATELET  ACETAMINOPHEN LEVEL  MAGNESIUM  CBG MONITORING, ED                                                                                                                         EKG  EKG Interpretation  Date/Time:  Saturday October 18 2020 05:58:15 EST Ventricular Rate:  127 PR Interval:    QRS Duration: 82 QT Interval:  314 QTC Calculation: 457 R Axis:   55 Text Interpretation: Sinus tachycardia ST elev, probable normal early repol pattern Confirmed by Drema Pry [phone]) on 10/18/2020 6:06:46 AM      Radiology No results found.  Pertinent labs & imaging results that were available during my care of the patient were reviewed by me and considered in my medical decision making (see chart for details).  Medications Ordered in ED Medications  sodium chloride 0.9 % bolus 1,000 mL (1,000 mLs Intravenous Bolus from Bag 10/18/20 0603)    And  sodium chloride 0.9 % bolus 1,000 mL (has no administration in time range)    And  0.9 %  sodium chloride infusion (has no administration in time range)                                                                                                                                    Procedures .1-3 Lead EKG Interpretation Performed by: Nira Conn, MD Authorized by: Nira Conn, MD     Interpretation: abnormal     ECG rate:  115   ECG rate assessment: tachycardic     Rhythm: sinus tachycardia      Ectopy: none     Conduction: normal      (including critical care time)  Medical Decision Making / ED Course I have reviewed the nursing notes for this encounter and the patient's prior records (if available in EHR or on provided paperwork).   Allen [name]. was evaluated in Emergency Department on 10/18/2020 for the symptoms described in the history of present illness. He was evaluated in the context of the global COVID-19 pandemic, which necessitated consideration that the patient might be at risk for infection with the SARS-CoV-2 virus that causes COVID-19. Institutional protocols and algorithms  that pertain to the evaluation of patients at risk for COVID-19 are in a state of rapid change based on information released by regulatory bodies including the CDC and federal and state organizations. These policies and algorithms were followed during the patient's care in the ED.  Suicide attempts by overdosing on hydroxyzine. Patient appears to be also intoxicated. He is tachycardic but otherwise stable vital signs. Screening labs obtain. Will need to monitor for at least 6 hours. IVC paperwork filled out.      Final Clinical Impression(s) / ED Diagnoses Final diagnoses:  Suicide attempt [facility])      This chart was dictated using voice recognition software.  Despite best efforts to proofread,  errors can occur which can change the documentation meaning.   Nira Conn, MD 10/18/20 [id number]

## 2020-10-18 NOTE — ED Triage Notes (Signed)
Pt called friend expressing desire to self harm.  EMS responded to welfare check. Pt reports taking (10) 25mg  hydroxyzine pills and consuming alcohol.

## 2020-10-18 NOTE — ED Notes (Signed)
Poison Control Almira Coaster RN has now closed the case.

## 2020-10-18 NOTE — Progress Notes (Signed)
PerJamison Lord, DNP, patient meets criteria for inpatient treatment. There are no available or appropriate beds at [facility] today. CSW faxed referrals to the following facilities for review:  [facility]  TTS will continue to seek bed placement.  Charidy Cappelletti, MSW, LCSW-A, LCAS-A Phone: [phone] Disposition/TOC 

## 2020-10-18 NOTE — ED Notes (Signed)
Pt aware he has been IVC'd, presently changing into burgundy scrubs. Pt upset due to not being told earlier as long as the need to go to work. Security has been called to wand pt.

## 2020-10-19 MED ORDER — ESCITALOPRAM OXALATE 10 MG PO TABS
5.0000 mg | ORAL_TABLET | Freq: Every day | ORAL | Status: DC
  Administered 2020-10-19: 5 mg via ORAL
  Filled 2020-10-19: qty 1

## 2020-10-19 MED ORDER — GABAPENTIN 100 MG PO CAPS
100.0000 mg | ORAL_CAPSULE | Freq: Three times a day (TID) | ORAL | Status: DC
  Administered 2020-10-19 (×2): 100 mg via ORAL
  Filled 2020-10-19 (×2): qty 1

## 2020-10-19 NOTE — [facility] Counselor (Signed)
Per J. Lord, DNP, Pt is suitable for transfer to [facility] for observation, stabilization, and med check.  Please call to [phone]. 

## 2020-10-19 NOTE — ED Notes (Signed)
Patient upset and stating he has had no medication and no shower and he is sweating, sitter explained when he gets ready for shower that can be performed, patient gets in bed and cover ups the head.

## 2020-10-19 NOTE — Progress Notes (Signed)
CSW re-faxed pt out to the following facilities, as he continues to meet inpatient treatment and there are no beds available at [facility] today.  [facility] [employer]   TTS and Disposition CSW continuing to assess.   Christyana Corwin S. Alan Ripper, MSW, LCSW Clinical Social Worker 10/19/2020 9:57 AM

## 2020-10-19 NOTE — ED Provider Notes (Signed)
Emergency Medicine Observation Re-evaluation Note  Allen [name]. is a 35 y.o. male, seen on rounds today.  Pt initially presented to the ED for complaints of Drug Overdose Currently, the patient is awaiting inpatient psych placement.  Physical Exam  BP 111/81 (BP Location: Right Arm)   Pulse 86   Temp 98.6 F (37 C) (Oral)   Resp 16   Ht 5\' 8"  (1.727 m)   Wt 97.5 kg   SpO2 99%   BMI 32.69 kg/m  Physical Exam General: Calm. Resting.  Cardiac: Well perfused.  Lungs: Even, unlabored respirations.  Psych: Calm.   ED Course / MDM  EKG:EKG Interpretation  Date/Time:  Saturday October 18 2020 05:58:15 EST Ventricular Rate:  127 PR Interval:    QRS Duration: 82 QT Interval:  314 QTC Calculation: 457 R Axis:   55 Text Interpretation: Sinus tachycardia ST elev, probable normal early repol pattern Confirmed by 12-14-1996 [phone]) on 10/18/2020 6:06:46 AM    I have reviewed the labs performed to date as well as medications administered while in observation.  Recent changes in the last 24 hours include TTS evaluation with recommendation for inpatient psych.   Plan  Current plan is for inpatient psych.   12/18/2020, MD 10/19/20 [phone]

## 2020-10-19 NOTE — ED Notes (Signed)
Patient asked for update. Explained to patient we are waiting for inpatient bed. Patient asked how this will work with his job. Education provided. Patient ambulatory to restroom at this time.

## 2020-10-19 NOTE — ED Notes (Signed)
Breakfast tray provided. 

## 2020-10-20 ENCOUNTER — Ambulatory Visit (HOSPITAL_COMMUNITY)
Admission: EM | Admit: 2020-10-20 | Discharge: 2020-10-20 | Disposition: A | Discharge status: Home / Self Care | Payer: No Payment, Other | Attending: Psychiatry | Admitting: Psychiatry

## 2020-10-20 DIAGNOSIS — K219 Gastro-esophageal reflux disease without esophagitis: Secondary | ICD-10-CM | POA: Insufficient documentation

## 2020-10-20 DIAGNOSIS — Z9151 Personal history of suicidal behavior: Secondary | ICD-10-CM | POA: Insufficient documentation

## 2020-10-20 DIAGNOSIS — F329 Major depressive disorder, single episode, unspecified: Secondary | ICD-10-CM

## 2020-10-20 DIAGNOSIS — Z79899 Other long term (current) drug therapy: Secondary | ICD-10-CM | POA: Insufficient documentation

## 2020-10-20 DIAGNOSIS — F1290 Cannabis use, unspecified, uncomplicated: Secondary | ICD-10-CM | POA: Insufficient documentation

## 2020-10-20 DIAGNOSIS — R45851 Suicidal ideations: Secondary | ICD-10-CM

## 2020-10-20 DIAGNOSIS — B20 Human immunodeficiency virus [HIV] disease: Secondary | ICD-10-CM | POA: Insufficient documentation

## 2020-10-20 DIAGNOSIS — F333 Major depressive disorder, recurrent, severe with psychotic symptoms: Secondary | ICD-10-CM | POA: Insufficient documentation

## 2020-10-20 DIAGNOSIS — Z8616 Personal history of COVID-19: Secondary | ICD-10-CM | POA: Insufficient documentation

## 2020-10-20 DIAGNOSIS — R450 Nervousness: Secondary | ICD-10-CM | POA: Insufficient documentation

## 2020-10-20 DIAGNOSIS — F419 Anxiety disorder, unspecified: Secondary | ICD-10-CM | POA: Insufficient documentation

## 2020-10-20 DIAGNOSIS — F17210 Nicotine dependence, cigarettes, uncomplicated: Secondary | ICD-10-CM | POA: Insufficient documentation

## 2020-10-20 DIAGNOSIS — R454 Irritability and anger: Secondary | ICD-10-CM | POA: Insufficient documentation

## 2020-10-20 MED ORDER — HYDROCHLOROTHIAZIDE 12.5 MG PO CAPS
12.5000 mg | ORAL_CAPSULE | Freq: Every day | ORAL | Status: DC
  Administered 2020-10-20: 12.5 mg via ORAL
  Filled 2020-10-20: qty 1
  Filled 2020-10-20: qty 7

## 2020-10-20 MED ORDER — ESCITALOPRAM OXALATE 10 MG PO TABS
10.0000 mg | ORAL_TABLET | Freq: Every day | ORAL | Status: DC
  Filled 2020-10-20: qty 7

## 2020-10-20 MED ORDER — TRAZODONE HCL 50 MG PO TABS: 50.0000 mg | ORAL_TABLET | Freq: Every evening | ORAL | Status: DC | PRN

## 2020-10-20 MED ORDER — FAMOTIDINE 20 MG PO TABS
20.0000 mg | ORAL_TABLET | Freq: Two times a day (BID) | ORAL | Status: DC
  Administered 2020-10-20 (×2): 20 mg via ORAL
  Filled 2020-10-20 (×2): qty 1

## 2020-10-20 MED ORDER — ACETAMINOPHEN 325 MG PO TABS
650.0000 mg | ORAL_TABLET | Freq: Four times a day (QID) | ORAL | Status: DC | PRN
  Administered 2020-10-20 (×2): 650 mg via ORAL
  Filled 2020-10-20 (×2): qty 2

## 2020-10-20 MED ORDER — ESCITALOPRAM OXALATE 10 MG PO TABS: 10.0000 mg | ORAL_TABLET | Freq: Every day | ORAL | 0 refills | Status: DC

## 2020-10-20 MED ORDER — ALUM & MAG HYDROXIDE-SIMETH 200-200-20 MG/5ML PO SUSP: 30.0000 mL | ORAL | Status: DC | PRN

## 2020-10-20 MED ORDER — BICTEGRAVIR-EMTRICITAB-TENOFOV 50-200-25 MG PO TABS
1.0000 | ORAL_TABLET | Freq: Every day | ORAL | Status: DC
  Administered 2020-10-20: 1 via ORAL
  Filled 2020-10-20: qty 1
  Filled 2020-10-20: qty 7

## 2020-10-20 MED ORDER — MAGNESIUM HYDROXIDE 400 MG/5ML PO SUSP: 30.0000 mL | Freq: Every day | ORAL | Status: DC | PRN

## 2020-10-20 MED ORDER — ESCITALOPRAM OXALATE 10 MG PO TABS
10.0000 mg | ORAL_TABLET | Freq: Once | ORAL | Status: AC
  Administered 2020-10-20: 10 mg via ORAL
  Filled 2020-10-20: qty 1

## 2020-10-20 NOTE — Progress Notes (Signed)
Pt is resting quietly. Alert and oriented X4. Pt is calm and cooperative. Complained of neck and back pain. PRN Tylenol administered. Pt denies SI, HI and AVH at this time. Staff will monitor for pt's safety.

## 2020-10-20 NOTE — Discharge Instructions (Signed)

## 2020-10-20 NOTE — ED Notes (Signed)
Patient belongings (clothing, slippers) in locker [phone]

## 2020-10-20 NOTE — ED Notes (Signed)
Patient D/C to [facility]. [employer] transportation. Report given to RN at [facility].

## 2020-10-20 NOTE — Progress Notes (Addendum)
Received Allen [name] this AM alert and oriented. He was mentally exhausted from his 2 day stay in the ED. He was cooperative with the admission process, escorted to his chair bed, compliant with his medications and drifted off to sleep without incident. Upon his arrival to the [facility] he endorsed feeling anxious and depressed. He denied feeling SI/HI.

## 2020-10-20 NOTE — Discharge Summary (Signed)
Allen [name]. to be D/C'd home per MD order. Discussed with the patient and all questions fully answered. An After Visit Summary was printed and given to the patient. Medication samples were also given to patient. Patient escorted out, and D/C home via safe transport. Dickie La  10/20/2020 4:35 PM

## 2020-10-20 NOTE — ED Provider Notes (Signed)
FBC/OBS ASAP Discharge Summary  Date and Time: 10/20/2020 5:16 PM  Name: Allen [name].  MRN:  [id number]   Discharge Diagnoses:  Final diagnoses:  Major depressive disorder with current active episode, unspecified depression episode severity, unspecified whether recurrent  Suicide ideation    Subjective:  Chart reviewed and patient seen. Etoh 109; UDS+THC, cocaine, amphetamines. Patient interviewed this AM, he is calm and cooperative, He recounts what brought him into the [name]. Pt states that "I was feeling suicidal" and states that he feeling overwhelmed due to work and has had difficulty keeping a job which has led to financial stressors. Pt states that he has always had difficulties keeping jobs. He states that he has seen a therapist currently, they have phone visits and they speak weekly; he finds it to helpful. Pt denies SI/HI/AVH and is able to contract for safety. He provides collateral information for Allen [name] who he identifies as his sister. Pt reports that he uses marijuana daily, and cocaine when he is "stressed"; he denies amphetamine use and raises concern that it was mixed in with something he took. Pt does not identify substance use as a problem and is not currently interested in seeking treatment for substance use; however, he states that he would like to establish care with a psychiatrist. Pt requesting work note on discharge.    Allen [name] (sister) [phone] Called at 1106 AM ; no answer left hippa compliant VM with callback number Called at 1:31 PM ; call went straight to VM. Left hippa compliant VM with call back number Called at 2:14 PM  Says she spoke to Allen [name] yesterday and today and encouraging him. He is her Production assistant, radio, "he is  like a brother to me". Says that he told her what occurred and is aware of hydroxyzine ingestion. She states that "he's been through a lot and is going through a lot". She describes patient having multiple stressors -grieving mother  and sister and best friend, health issues (specifically referring to patient being HIV +), financial stressors, difficulty holding a job. She states that she feels he would be safe for discharge as long as he is on medication. She states that "here and there he do have a breakdown". She recommended that he try to get on disability. He's OCD, bioplar, depression, ADHD. Allen [name] often helps with her kids - she has 5 kids and he enjoys taking care of them.    Says that she does not think he is a danger to himself and would be safe to come home and would like to come home with medication. Discussed safety planning. Says that she would take off work and spend the day with him and also will have the neighbor whom she is friends with check on him until she is able to get there.  Stay Summary:   Patient presented to the [facility] ED after he took 10 25 mg hydroxyzine in an attempt to harm himself. He was admitted to the [name] for continuous observation once medically cleared. Pt reported stressors upon arrival to Allen [name] as mental health issues, and having trust issues with friends and family." Patient reports a history of depression. He reports that he was diagnosed with depression. He admits to depressive symptoms of "irritability, hopelessness, lack of motivation, mood swings, and sadness." Patient reports that he had attempted suicide multiple times previously. He states "I took pills on several occasions to try to kill myself from when I was like 35 years old till around 35 year old." he  reported he was treated inpatient [place] living in [place] and recieved ECT treatment. He denied HI and reported VH of shadows and AH of voices upon [name]. [facility] and started on lexapro. His UDS is positive for THC, cocaine, and amphetamine.    Later in the day, patient denied SI/HI/AVH/psychotic sx. Pt was requesting discharge; explained to patient that safety planning  would need to be done. He provided information to contact Allen Nimroddrian as above who he identifies as his sister. See above-she denied safety concerns and felt that patient would be safe for discharge. Patient was discharged with 7 day samples and encouraged to present for open [name]. Pt denied SI/HI/AVH at time of discharge and was future oriented as he was requesting work note.    Total Time spent with patient: 30 minutes  Past Psychiatric History: see H&P Past Medical History:  Past Medical History:  Diagnosis Date  . Anxiety   . COVID   . Fracture of metacarpal base of right hand, closed 11/2015   right small finger  . GERD (gastroesophageal reflux disease)    no current med.  Marland Kitchen. HIV (human immunodeficiency virus infection) (HCC)   . Hypertension     Past Surgical History:  Procedure Laterality Date  . NO PAST SURGERIES    . OPEN REDUCTION INTERNAL FIXATION (ORIF) METACARPAL Right 12/09/2015   Procedure: OPEN REDUCTION INTERNAL FIXATION (ORIF) RIGHT SMALL FINGER/METACARPAL;  Surgeon: Betha LoaKevin Kuzma, MD;  Location: [facility];  Service: Orthopedics;  Laterality: Right;   Family History:  Family History  Problem Relation Age of Onset  . Cancer Mother    Family Psychiatric History: see H&P Social History:  Social History   Substance and Sexual Activity  Alcohol Use Yes  . Alcohol/week: 1.0 standard drink  . Types: 1 Cans of beer per week   Comment: socially     Social History   Substance and Sexual Activity  Drug Use Yes  . Types: Marijuana, Cocaine   Comment: marijuana daily     Social History   Socioeconomic History  . Marital status: Single    Spouse name: Not on file  . Number of children: Not on file  . Years of education: Not on file  . Highest education level: Not on file  Occupational History  . Not on file  Tobacco Use  . Smoking status: Current Every Day Smoker    Packs/day: 0.50    Years: 10.00    Pack years: 5.00     Types: Cigarettes  . Smokeless tobacco: Never Used  Vaping Use  . Vaping Use: Never used  Substance and Sexual Activity  . Alcohol use: Yes    Alcohol/week: 1.0 standard drink    Types: 1 Cans of beer per week    Comment: socially  . Drug use: Yes    Types: Marijuana, Cocaine    Comment: marijuana daily   . Sexual activity: Not Currently  Other Topics Concern  . Not on file  Social History Narrative  . Not on file   Social Determinants of Health   Financial Resource Strain: Not on file  Food Insecurity: Not on file  Transportation Needs: Not on file  Physical Activity: Not on file  Stress: Not on file  Social Connections: Not on file   SDOH:  SDOH Screenings   Alcohol Screen: Not on file  Depression (PHQ2-9): Low Risk   .  PHQ-2 Score: 0  Financial Resource Strain: Not on file  Food Insecurity: Not on file  Housing: Not on file  Physical Activity: Not on file  Social Connections: Not on file  Stress: Not on file  Tobacco Use: High Risk  . Smoking Tobacco Use: Current Every Day Smoker  . Smokeless Tobacco Use: Never Used  Transportation Needs: Not on file    Has this patient used any form of tobacco in the last 30 days? (Cigarettes, Smokeless Tobacco, Cigars, and/or Pipes) Prescription not provided because: n/a  Current Medications:  Current Facility-Administered Medications  Medication Dose Route Frequency Provider Last Rate Last Admin  . acetaminophen (TYLENOL) tablet 650 mg  650 mg Oral Q6H PRN Ajibola, Ene A, NP   650 mg at 10/20/20 0918  . alum & mag hydroxide-simeth (MAALOX/MYLANTA) 200-200-20 MG/5ML suspension 30 mL  30 mL Oral Q4H PRN Ajibola, Ene A, NP      . bictegravir-emtricitabine-tenofovir AF (BIKTARVY) 50-200-25 MG per tablet 1 tablet  1 tablet Oral Daily Ajibola, Ene A, NP   1 tablet at 10/20/20 0918  . [START ON 10/21/2020] escitalopram (LEXAPRO) tablet 10 mg  10 mg Oral Daily Ajibola, Ene A, NP      . famotidine (PEPCID) tablet 20 mg  20 mg Oral  BID Ajibola, Ene A, NP   20 mg at 10/20/20 0918  . hydrochlorothiazide (MICROZIDE) capsule 12.5 mg  12.5 mg Oral Daily Ajibola, Ene A, NP   12.5 mg at 10/20/20 0918  . magnesium hydroxide (MILK OF MAGNESIA) suspension 30 mL  30 mL Oral Daily PRN Ajibola, Ene A, NP      . traZODone (DESYREL) tablet 50 mg  50 mg Oral QHS PRN Ajibola, Ene A, NP       Current Outpatient Medications  Medication Sig Dispense Refill  . albuterol (VENTOLIN HFA) 108 (90 Base) MCG/ACT inhaler Inhale 2 puffs into the lungs every 6 (six) hours as needed for wheezing or shortness of breath. 18 g 0  . BIKTARVY 50-200-25 MG TABS tablet Take 1 tablet by mouth daily. 30 tablet 5  . cetirizine (ZYRTEC ALLERGY) 10 MG tablet Take 1 tablet (10 mg total) by mouth daily. 30 tablet 0  . [START ON 10/21/2020] escitalopram (LEXAPRO) 10 MG tablet Take 1 tablet (10 mg total) by mouth daily. 7 tablet 0  . famotidine (PEPCID) 20 MG tablet Take 1 tablet (20 mg total) by mouth 2 (two) times daily. (Patient taking differently: Take 20 mg by mouth daily.) 30 tablet 2  . hydrochlorothiazide (MICROZIDE) 12.5 MG capsule TAKE 1 CAPSULE(12.5 MG) BY MOUTH DAILY (Patient taking differently: Take 12.5 mg by mouth daily.) 30 capsule 5  . predniSONE (DELTASONE) 20 MG tablet Take 2 tablets daily with breakfast. (Patient not taking: No sig reported) 10 tablet 0    PTA Medications: (Not in a [name] admission)   Musculoskeletal  Strength & Muscle Tone: within normal limits Gait & Station: normal Patient leans: N/A  Psychiatric Specialty Exam  Presentation  General Appearance: Appropriate for Environment; Casual  Eye Contact:Good  Speech:Clear and Coherent; Normal Rate  Speech Volume:Normal  Handedness:Right   Mood and Affect  Mood:Euthymic  Affect:Appropriate; Congruent   Thought Process  Thought Processes:Coherent; Goal Directed; Linear  Descriptions of Associations:Intact  Orientation:Full (Time, Place and Person)  Thought  Content:WDL  Hallucinations:Hallucinations: None   Ideas of Reference:None  Suicidal Thoughts:Suicidal Thoughts: No   Homicidal Thoughts:Homicidal Thoughts: No   Sensorium  Memory:Immediate Good; Recent Good; Remote Good  Judgment:Fair  Insight:Fair   Executive Functions  Concentration:Good  Attention Span:Good  Recall:Good  Fund of Knowledge:Good  Language:Good   Psychomotor Activity  Psychomotor Activity:Psychomotor Activity: Normal   Assets  Assets:Communication Skills; Desire for Improvement; Resilience; Social Support; Talents/Skills   Sleep  Sleep:Sleep: Fair   Nutritional Assessment (For OBS and FBC admissions only) Has the patient had a weight loss or gain of 10 pounds or more in the last 3 months?: No Has the patient had a decrease in food intake/or appetite?: No Does the patient have dental problems?: No Does the patient have eating habits or behaviors that may be indicators of an eating disorder including binging or inducing vomiting?: No Has the patient recently lost weight without trying?: No Has the patient been eating poorly because of a decreased appetite?: No Malnutrition Screening Tool Score: 0    Physical Exam  Physical Exam Constitutional:      Appearance: Normal appearance.  HENT:     Head: Atraumatic.  Pulmonary:     Effort: Pulmonary effort is normal.  Neurological:     Mental Status: He is alert.    Review of Systems  Constitutional: Negative for chills and fever.  Eyes: Negative for discharge and redness.  Respiratory: Negative for cough.   Cardiovascular: Negative for chest pain.  Musculoskeletal: Negative for myalgias.  Neurological: Negative for headaches.  Psychiatric/Behavioral: Positive for depression and substance abuse. Negative for hallucinations and suicidal ideas.   Blood pressure (!) 132/105, pulse 85, temperature 98.6 F (37 C), temperature source Oral, resp. rate 18, SpO2 100 %. There is no height or  weight on file to calculate BMI.  Demographic Factors:  Male, Low socioeconomic status and Living alone  Loss Factors: Loss of significant relationship and Financial problems/change in socioeconomic status  Historical Factors: Prior suicide attempts and Impulsivity  Risk Reduction Factors:   Sense of responsibility to family, Employed, Positive social support and Positive therapeutic relationship  Continued Clinical Symptoms:  Alcohol/Substance Abuse/Dependencies  Cognitive Features That Contribute To Risk:  Thought constriction (tunnel vision)    Suicide Risk:  Minimal: No identifiable suicidal ideation.  Patients presenting with no risk factors but with morbid ruminations; may be classified as minimal risk based on the severity of the depressive symptoms  Plan Of Care/Follow-up recommendations:  Activity:  as tolerated Diet:  regular Other:    Patient is instructed prior to discharge to: Take all medications as prescribed by his/her mental healthcare provider. Report any adverse effects and or reactions from the medicines to his/her outpatient provider promptly. Patient has been instructed & cautioned: To not engage in alcohol and or illegal drug use while on prescription medicines. In the event of worsening symptoms, patient is instructed to call the crisis hotline, 911 and or go to the nearest ED for appropriate evaluation and treatment of symptoms. To follow-up with his/her primary care provider for your other medical issues, concerns and or health care needs.   Please come to [facility] (this facility) during walk in hours for appointment with psychiatrist for further medication management and for therapy.   Walk in hours are 8-11 AM Monday through Thursday for medication management.It is first come, first -serve; it is best to arrive by 7:00 AM. On Friday from 1 pm to 4 pm for therapy intake only. Please arrive by 12:00 pm as it is  first come,  first -serve.   When you arrive please go upstairs for your appointment. If you are unsure of where to go,  inform the front desk that you are here for a walk in appointment and they will assist you with directions upstairs.  Address:  [address] in [place], [address] Ph: [phone]    Disposition: self care  Estella Husk, MD 10/20/2020, 5:16 PM

## 2020-10-20 NOTE — ED Provider Notes (Addendum)
Behavioral Health Admission H&P [facility] & OBS)  Date: 10/20/20 Patient Name: Allen [name]. MRN: [id number] Chief Complaint: No chief complaint on file.     Diagnoses:  Final diagnoses:  Major depressive disorder with current active episode, unspecified depression episode severity, unspecified whether recurrent  Suicide ideation    HPI: Allen Cunninghamis a 34y/o male with anxiety, GERD, HIV, hypertension. Presented as a transfer from [facility] under IVC paperwork. Patient was transferred from [facility] to [facility] for continuous assessment and stabilization for safety while awaiting inpatient psychiatric bed to become available at [facility]. Patient is evaluated face to face by this writer upon arrival to [facility]. Patient is alert and oriented X4, he is minimally cooperative and irritable, speech is pressured and normal in tone and volume, he maintains good eye contact, his mood is anxious, depressed, and angry.   Patient presented under IVC,second opinion needed by 0500 10/21/20.  Patient presented with ongoing suicidal ideations. Patient admits to intentionally ingesting ~10 hydroxyzine  tablets in an attempt to harm himself. Patient states "if my plan went the way it was possible to, I wouldn't have to be dealing with all this." Patient report that he was triggered by "stress from work, not been able to get in touch with my therapist when I needed to talk to someone about my mental health issues, and having trust issues with friends and family." Patient reports a history of depression. He reports that he was diagnosed with depression. He admits to depressive symptoms of "irritability, hopelessness, lack of motivation, mood swings, and sadness." Patient reports that he had attempted suicide multiple times previously. He states "I took pills on several occasions to try to kill myself from when I was like 35 years old till around 35 year old." he reported he was treated inpatient while living in [place] and  recieved ECT treatment. Patient states "I think that ECT messed with my brains."    Patient denies HI. he endorses visual and auditory hallucination; he reports that he hears his deceased mother and sister's voices but will not share what they voices are saying to him. He also reports visual hallucination of "seeing shadows." He reports "hallucination has been present for a very long time but I haven't told anyone about it." He denies feeling paranoid and there's no indication of delusion. He admits to drinking alcohol socially but will not quantify how much alcohol he drinks. He admits to cocaine and marijuana use. His UDS is positive for THC, cocaine, and amphetamine.  He reports that he lives at [facility] and  currently employed with [employer] 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  PHQ 2-9:   Flowsheet Row ED from 10/18/2020 in [facility]  C-SSRS RISK CATEGORY High Risk       Total Time spent with patient: 30 minutes  Musculoskeletal  Strength & Muscle Tone: within normal limits Gait & Station: normal Patient leans: Right  Psychiatric Specialty Exam  Presentation General Appearance: Appropriate for Environment  Eye Contact:Good  Speech:Pressured  Speech Volume:Normal  Handedness:Right   Mood and Affect  Mood:Anxious; Depressed; Hopeless; Angry  Affect:Depressed   Thought Process  Thought Processes:Coherent  Descriptions of Associations:Intact  Orientation:Full (Time, Place and Person)  Thought Content:WDL   Hallucinations:Hallucinations: Auditory Description of Auditory Hallucinations: patient states "I hear my dead mom and sister's voices"  Ideas of Reference:None  Suicidal Thoughts:Suicidal Thoughts: Yes, Active SI Active Intent  and/or Plan: With Plan  Homicidal Thoughts:Homicidal Thoughts: No   Sensorium  Memory:Immediate Good; Recent Good;  Remote Good  Judgment:Fair  Insight:Fair   Executive Functions  Concentration:Good  Attention Span:Good  Recall:Good  Fund of Knowledge:Good  Language:Good   Psychomotor Activity  Psychomotor Activity:Psychomotor Activity: Normal   Assets  Assets:Desire for Improvement; Financial Resources/Insurance; Vocational/Educational   Sleep  Sleep:Sleep: Good   Nutritional Assessment (For OBS and FBC admissions only) Has the patient had a weight loss or gain of 10 pounds or more in the last 3 months?: No Has the patient had a decrease in food intake/or appetite?: No Does the patient have dental problems?: No Does the patient have eating habits or behaviors that may be indicators of an eating disorder including binging or inducing vomiting?: No Has the patient recently lost weight without trying?: No Has the patient been eating poorly because of a decreased appetite?: No Malnutrition Screening Tool Score: 0    Physical Exam Vitals and nursing note reviewed.  Constitutional:      Appearance: He is well-developed.  HENT:     Head: Normocephalic and atraumatic.  Eyes:     General:        Right eye: No discharge.        Left eye: No discharge.     Conjunctiva/sclera: Conjunctivae normal.  Cardiovascular:     Rate and Rhythm: Normal rate and regular rhythm.     Heart sounds: No murmur heard.   Pulmonary:     Effort: Pulmonary effort is normal. No respiratory distress.     Breath sounds: Normal breath sounds.  Abdominal:     Palpations: Abdomen is soft.     Tenderness: There is no abdominal tenderness.  Musculoskeletal:     Cervical back: Neck supple. No rigidity.  Skin:    General: Skin is warm and dry.  Neurological:     Mental Status: He is alert and oriented to person, place, and time.  Psychiatric:        Attention and Perception: He perceives auditory and visual hallucinations.        Mood and Affect: Mood is depressed.        Speech: Speech is rapid and  pressured.        Behavior: Behavior is not agitated.        Thought Content: Thought content is not paranoid or delusional. Thought content includes suicidal ideation. Thought content does not include homicidal ideation. Thought content includes suicidal plan. Thought content does not include homicidal plan.        Cognition and Memory: Cognition normal.    Review of Systems  Constitutional: Negative.   HENT: Negative for ear discharge and ear pain.   Eyes: Negative for pain, discharge and redness.  Respiratory: Negative.  Negative for cough, hemoptysis, sputum production, shortness of breath and wheezing.   Cardiovascular: Negative for chest pain, palpitations and leg swelling.  Gastrointestinal: Negative for nausea and vomiting.  Skin: Negative for itching.  Neurological: Negative for loss of consciousness.  Psychiatric/Behavioral: Positive for depression, hallucinations, substance abuse and suicidal ideas. The patient is nervous/anxious.     Blood pressure (!) 134/98, pulse 98, temperature 98.1 F (36.7 C), temperature source Oral, resp. rate 18, SpO2 100 %. There is no height or weight on file to calculate BMI.  Past Psychiatric History:    Is the patient at risk to self? Yes  Has the patient been a risk to self in the past 6 months? No .  Has the patient been a risk to self within the distant past? Yes   Is the patient a risk to others? No   Has the patient been a risk to others in the past 6 months? No   Has the patient been a risk to others within the distant past? No   Past Medical History:  Past Medical History:  Diagnosis Date  . Anxiety   . COVID   . Fracture of metacarpal base of right hand, closed 11/2015   right small finger  . GERD (gastroesophageal reflux disease)    no current med.  Marland Kitchen HIV (human immunodeficiency virus infection) (HCC)   . Hypertension     Past Surgical History:  Procedure Laterality Date  . NO PAST SURGERIES    . OPEN REDUCTION INTERNAL  FIXATION (ORIF) METACARPAL Right 12/09/2015   Procedure: OPEN REDUCTION INTERNAL FIXATION (ORIF) RIGHT SMALL FINGER/METACARPAL;  Surgeon: Betha Loa, MD;  Location: [facility];  Service: Orthopedics;  Laterality: Right;    Family History:  Family History  Problem Relation Age of Onset  . Cancer Mother     Social History:  Social History   Socioeconomic History  . Marital status: Single    Spouse name: Not on file  . Number of children: Not on file  . Years of education: Not on file  . Highest education level: Not on file  Occupational History  . Not on file  Tobacco Use  . Smoking status: Current Every Day Smoker    Packs/day: 0.50    Years: 10.00    Pack years: 5.00    Types: Cigarettes  . Smokeless tobacco: Never Used  Vaping Use  . Vaping Use: Never used  Substance and Sexual Activity  . Alcohol use: Yes    Alcohol/week: 1.0 standard drink    Types: 1 Cans of beer per week    Comment: socially  . Drug use: Yes    Types: Marijuana, Cocaine    Comment: marijuana daily   . Sexual activity: Not Currently  Other Topics Concern  . Not on file  Social History Narrative  . Not on file   Social Determinants of Health   Financial Resource Strain: Not on file  Food Insecurity: Not on file  Transportation Needs: Not on file  Physical Activity: Not on file  Stress: Not on file  Social Connections: Not on file  Intimate Partner Violence: Not on file    SDOH:  SDOH Screenings   Alcohol Screen: Not on file  Depression (PHQ2-9): Low Risk   . PHQ-2 Score: 0  Financial Resource Strain: Not on file  Food Insecurity: Not on file  Housing: Not on file  Physical Activity: Not on file  Social Connections: Not on file  Stress: Not on file  Tobacco Use: High Risk  . Smoking Tobacco Use: Current Every Day Smoker  . Smokeless Tobacco Use: Never Used  Transportation Needs: Not on file    Last Labs:  Admission on 10/18/2020, Discharged on 10/20/2020   Component Date Value Ref Range Status  . Sodium 10/18/2020 136  135 - 145 mmol/L Final  . Potassium 10/18/2020 3.5  3.5 - 5.1 mmol/L Final  . Chloride 10/18/2020 99  98 - 111 mmol/L Final  . CO2 10/18/2020 22  22 - 32 mmol/L Final  . Glucose, Bld 10/18/2020 108* 70 - 99 mg/dL Final   Glucose reference range applies only to samples taken after fasting for at least 8 hours.  Marland Kitchen BUN  10/18/2020 9  6 - 20 mg/dL Final  . Creatinine, Ser 10/18/2020 1.04  0.61 - 1.24 mg/dL Final  . Calcium 29/56/2130 9.6  8.9 - 10.3 mg/dL Final  . Total Protein 10/18/2020 8.1  6.5 - 8.1 g/dL Final  . Albumin 86/57/8469 4.7  3.5 - 5.0 g/dL Final  . AST 62/95/2841 24  15 - 41 U/L Final  . ALT 10/18/2020 28  0 - 44 U/L Final  . Alkaline Phosphatase 10/18/2020 56  38 - 126 U/L Final  . Total Bilirubin 10/18/2020 0.8  0.3 - 1.2 mg/dL Final  . GFR, Estimated 10/18/2020 >60  >60 mL/min Final   Comment: (NOTE) Calculated using the CKD-EPI Creatinine Equation (2021)   . Anion gap 10/18/2020 15  5 - 15 Final   Performed at [facility], [address] [place], [place] [address]  . Salicylate Lvl 10/18/2020 <7.0* 7.0 - 30.0 mg/dL Final   Performed at [facility], [address], [place], [place] [address]  . Acetaminophen (Tylenol), Serum 10/18/2020 <10* 10 - 30 ug/mL Final   Comment: (NOTE) Therapeutic concentrations vary significantly. A range of 10-30 ug/mL  may be an effective concentration for many patients. However, some  are best treated at concentrations outside of this range. Acetaminophen concentrations >150 ug/mL at 4 hours after ingestion  and >50 ug/mL at 12 hours after ingestion are often associated with  toxic reactions.  Performed at [facility], [address] [place], [place] [address]   . Alcohol, Ethyl (B) 10/18/2020 109* <10 mg/dL Final   Comment: (NOTE) Lowest detectable limit for serum alcohol is 10 mg/dL.  For medical purposes  only. Performed at [facility], [address] [place], [place] [address]   . Opiates 10/18/2020 NONE DETECTED  NONE DETECTED Final  . Cocaine 10/18/2020 POSITIVE* NONE DETECTED Final  . Benzodiazepines 10/18/2020 NONE DETECTED  NONE DETECTED Final  . Amphetamines 10/18/2020 POSITIVE* NONE DETECTED Final  . Tetrahydrocannabinol 10/18/2020 POSITIVE* NONE DETECTED Final  . Barbiturates 10/18/2020 NONE DETECTED  NONE DETECTED Final   Comment: (NOTE) DRUG SCREEN FOR MEDICAL PURPOSES ONLY.  IF CONFIRMATION IS NEEDED FOR ANY PURPOSE, NOTIFY LAB WITHIN 5 DAYS.  LOWEST DETECTABLE LIMITS FOR URINE DRUG SCREEN Drug Class                     Cutoff (ng/mL) Amphetamine and metabolites    1000 Barbiturate and metabolites    200 Benzodiazepine                 200 Tricyclics and metabolites     300 Opiates and metabolites        300 Cocaine and metabolites        300 THC                            50 Performed at [facility], [address] [place], [place] [address]   . WBC 10/18/2020 5.4  4.0 - 10.5 K/uL Final  . RBC 10/18/2020 5.02  4.22 - 5.81 MIL/uL Final  . Hemoglobin 10/18/2020 13.7  13.0 - 17.0 g/dL Final  . HCT 63/87/5643 40.9  39.0 - 52.0 % Final  . MCV 10/18/2020 81.5  80.0 - 100.0 fL Final  . MCH 10/18/2020 27.3  26.0 - 34.0 pg Final  . MCHC 10/18/2020 33.5  30.0 - 36.0 g/dL Final  . RDW 32/95/1884 14.6  11.5 - 15.5 % Final  . Platelets 10/18/2020 315  150 - 400 K/uL Final  . nRBC 10/18/2020 0.0  0.0 - 0.2 % Final  . Neutrophils Relative % 10/18/2020 57  % Final  . Neutro Abs 10/18/2020 3.1  1.7 - 7.7 K/uL Final  . Lymphocytes Relative 10/18/2020 34  % Final  . Lymphs Abs 10/18/2020 1.8  0.7 - 4.0 K/uL Final  . Monocytes Relative 10/18/2020 7  % Final  . Monocytes Absolute 10/18/2020 0.4  0.1 - 1.0 K/uL Final  . Eosinophils Relative 10/18/2020 1  % Final  . Eosinophils Absolute 10/18/2020 0.0  0.0 - 0.5 K/uL Final  . Basophils  Relative 10/18/2020 1  % Final  . Basophils Absolute 10/18/2020 0.0  0.0 - 0.1 K/uL Final  . Immature Granulocytes 10/18/2020 0  % Final  . Abs Immature Granulocytes 10/18/2020 0.02  0.00 - 0.07 K/uL Final   Performed at [facility], [address], [place], [place] [address]  . Acetaminophen (Tylenol), Serum 10/18/2020 <10* 10 - 30 ug/mL Final   Comment: (NOTE) Therapeutic concentrations vary significantly. A range of 10-30 ug/mL  may be an effective concentration for many patients. However, some  are best treated at concentrations outside of this range. Acetaminophen concentrations >150 ug/mL at 4 hours after ingestion  and >50 ug/mL at 12 hours after ingestion are often associated with  toxic reactions.  Performed at [facility], [address] [place], [place] [address]   . Magnesium 10/18/2020 2.0  1.7 - 2.4 mg/dL Final   Performed at [facility], [address], [place], [place] [address]  . SARS Coronavirus 2 by RT PCR 10/18/2020 NEGATIVE  NEGATIVE Final   Comment: (NOTE) SARS-CoV-2 target nucleic acids are NOT DETECTED.  The SARS-CoV-2 RNA is generally detectable in upper respiratory specimens during the acute phase of infection. The lowest concentration of SARS-CoV-2 viral copies this assay can detect is 138 copies/mL. A negative result does not preclude SARS-Cov-2 infection and should not be used as the sole basis for treatment or other patient management decisions. A negative result may occur with  improper specimen collection/handling, submission of specimen other than nasopharyngeal swab, presence of viral mutation(s) within the areas targeted by this assay, and inadequate number of viral copies(<138 copies/mL). A negative result must be combined with clinical observations, patient history, and epidemiological information. The expected result is Negative.  Fact Sheet for Patients:   BloggerCourse.com  Fact Sheet for Healthcare Providers:  SeriousBroker.it  This test is no                          t yet approved or cleared by the [place] FDA and  has been authorized for detection and/or diagnosis of SARS-CoV-2 by FDA under an Emergency Use Authorization (EUA). This EUA will remain  in effect (meaning this test can be used) for the duration of the COVID-19 declaration under Section 564(b)(1) of the Act, 21 U.S.C.section 360bbb-3(b)(1), unless the authorization is terminated  or revoked sooner.      . Influenza A by PCR 10/18/2020 NEGATIVE  NEGATIVE Final  . Influenza B by PCR 10/18/2020 NEGATIVE  NEGATIVE Final   Comment: (NOTE) The Xpert Xpress SARS-CoV-2/FLU/RSV plus assay is intended as an aid in the diagnosis of influenza from Nasopharyngeal swab specimens and should not be used as a sole basis for treatment. Nasal washings and aspirates are unacceptable for Xpert Xpress SARS-CoV-2/FLU/RSV  testing.  Fact Sheet for Patients: BloggerCourse.com  Fact Sheet for Healthcare Providers: SeriousBroker.it  This test is not yet approved or cleared by the [place] FDA and has been authorized for detection and/or diagnosis of SARS-CoV-2 by FDA under an Emergency Use Authorization (EUA). This EUA will remain in effect (meaning this test can be used) for the duration of the COVID-19 declaration under Section 564(b)(1) of the Act, 21 U.S.C. section 360bbb-3(b)(1), unless the authorization is terminated or revoked.  Performed at [facility], [address], [place], [place] [address]   Lab on 08/28/2020  Component Date Value Ref Range Status  . CD4 T Helper % 08/28/2020 51  30 - 61 % Final  . Absolute CD4 08/28/2020 1,004  490 - 1,740 cells/uL Final  . Total lymphocyte count 08/28/2020 1,973  850 - 3,900 cells/uL Final   Comment: The  analytical performance characteristics of this assay  have been determined by [employer]. The  modifications have not been cleared or approved by the  FDA. This assay has been validated pursuant to the CLIA  regulations and is used for clinical purposes.   . RPR Ser Ql 08/28/2020 REACTIVE* NON-REACTI Final  . Cholesterol 08/28/2020 149  <200 mg/dL Final  . HDL 60/45/4098 45  > OR = 40 mg/dL Final  . Triglycerides 08/28/2020 108  <150 mg/dL Final  . LDL Cholesterol (Calc) 08/28/2020 84  mg/dL (calc) Final   Comment: Reference range: <100 . Desirable range <100 mg/dL for primary prevention;   <70 mg/dL for patients with CHD or diabetic patients  with > or = 2 CHD risk factors. Marland Kitchen LDL-C is now calculated using the Martin-Hopkins  calculation, which is a validated novel method providing  better accuracy than the Friedewald equation in the  estimation of LDL-C.  Horald Pollen et al. Lenox Ahr. 1191;478(29): 2061-2068  (http://education.[facility].com/faq/FAQ164)   . Total CHOL/HDL Ratio 08/28/2020 3.3  <5.6 (calc) Final  . Non-HDL Cholesterol (Calc) 08/28/2020 104  <130 mg/dL (calc) Final   Comment: For patients with diabetes plus 1 major ASCVD risk  factor, treating to a non-HDL-C goal of <100 mg/dL  (LDL-C of <21 mg/dL) is considered a therapeutic  option.   Marland Kitchen HIV 1 RNA Quant 08/28/2020 <20* Copies/mL Final   HIV-1 RNA Detected  . HIV-1 RNA Quant, Log 08/28/2020 <1.30* Log cps/mL Final   Comment: HIV-1 RNA Detected . HIV-1 RNA was detected below 20 copies/mL. Viral nucleic acid detected below this level cannot be quantified by the assay. . . Reference Range:                           Not Detected     copies/mL                           Not Detected Log copies/mL . Marland Kitchen The test was performed using Real-Time Polymerase Chain Reaction. . . Reportable Range: 20 copies/mL to 10,000,000 copies/mL (1.30 Log copies/mL to 7.00 Log copies/mL). .   . Glucose, Bld 08/28/2020  106* 65 - 99 mg/dL Final   Comment: .            Fasting reference interval . For someone without known diabetes, a glucose value between 100 and 125 mg/dL is consistent with prediabetes and should be confirmed with a follow-up test. .   . BUN 08/28/2020 8  7 - 25 mg/dL Final  . Creat 30/86/5784  1.13  0.60 - 1.35 mg/dL Final  . GFR, Est Non African American 08/28/2020 84  > OR = 60 mL/min/1.64m2 Final  . GFR, Est African American 08/28/2020 98  > OR = 60 mL/min/1.3m2 Final  . BUN/Creatinine Ratio 08/28/2020 NOT APPLICABLE  6 - 22 (calc) Final  . Sodium 08/28/2020 141  135 - 146 mmol/L Final  . Potassium 08/28/2020 4.4  3.5 - 5.3 mmol/L Final  . Chloride 08/28/2020 103  98 - 110 mmol/L Final  . CO2 08/28/2020 29  20 - 32 mmol/L Final  . Calcium 08/28/2020 10.0  8.6 - 10.3 mg/dL Final  . Total Protein 08/28/2020 7.3  6.1 - 8.1 g/dL Final  . Albumin 34/28/7681 4.8  3.6 - 5.1 g/dL Final  . Globulin 15/72/6203 2.5  1.9 - 3.7 g/dL (calc) Final  . AG Ratio 08/28/2020 1.9  1.0 - 2.5 (calc) Final  . Total Bilirubin 08/28/2020 0.6  0.2 - 1.2 mg/dL Final  . Alkaline phosphatase (APISO) 08/28/2020 62  36 - 130 U/L Final  . AST 08/28/2020 17  10 - 40 U/L Final  . ALT 08/28/2020 28  9 - 46 U/L Final  . WBC 08/28/2020 4.1  3.8 - 10.8 Thousand/uL Final  . RBC 08/28/2020 5.37  4.20 - 5.80 Million/uL Final  . Hemoglobin 08/28/2020 14.5  13.2 - 17.1 g/dL Final  . HCT 55/97/4163 43.3  38.5 - 50.0 % Final  . MCV 08/28/2020 80.6  80.0 - 100.0 fL Final  . MCH 08/28/2020 27.0  27.0 - 33.0 pg Final  . MCHC 08/28/2020 33.5  32.0 - 36.0 g/dL Final  . RDW 84/53/6468 13.5  11.0 - 15.0 % Final  . Platelets 08/28/2020 395  140 - 400 Thousand/uL Final  . MPV 08/28/2020 10.1  7.5 - 12.5 fL Final  . Neutro Abs 08/28/2020 1,726  1,500 - 7,800 cells/uL Final  . Lymphs Abs 08/28/2020 1,816  850 - 3,900 cells/uL Final  . Absolute Monocytes 08/28/2020 406  200 - 950 cells/uL Final  . Eosinophils Absolute  08/28/2020 111  15 - 500 cells/uL Final  . Basophils Absolute 08/28/2020 41  0 - 200 cells/uL Final  . Neutrophils Relative % 08/28/2020 42.1  % Final  . Total Lymphocyte 08/28/2020 44.3  % Final  . Monocytes Relative 08/28/2020 9.9  % Final  . Eosinophils Relative 08/28/2020 2.7  % Final  . Basophils Relative 08/28/2020 1.0  % Final  . RPR Titer 08/28/2020 1:2*  Final  . Fluorescent Treponemal ABS 08/28/2020 REACTIVE* NON-REACTI Final   Comment: The FTA-ABS is a treponemal assay that is intended to  be used with other tests (e.g. RPR) as part of a  diagnostic algorithm in the diagnosis of syphilis. .   Admission on 06/28/2020, Discharged on 06/28/2020  Component Date Value Ref Range Status  . Sodium 06/28/2020 140  135 - 145 mmol/L Final  . Potassium 06/28/2020 3.4* 3.5 - 5.1 mmol/L Final  . Chloride 06/28/2020 105  98 - 111 mmol/L Final  . CO2 06/28/2020 23  22 - 32 mmol/L Final  . Glucose, Bld 06/28/2020 139* 70 - 99 mg/dL Final   Glucose reference range applies only to samples taken after fasting for at least 8 hours.  . BUN 06/28/2020 5* 6 - 20 mg/dL Final  . Creatinine, Ser 06/28/2020 1.03  0.61 - 1.24 mg/dL Final  . Calcium 10/28/2246 9.4  8.9 - 10.3 mg/dL Final  . GFR, Estimated 06/28/2020 >60  >60 mL/min Final   Comment: (  NOTE) Calculated using the CKD-EPI Creatinine Equation (2021)   . Anion gap 06/28/2020 12  5 - 15 Final   Performed at [facility], [address] [place], [place] [address]  . WBC 06/28/2020 3.7* 4.0 - 10.5 K/uL Final  . RBC 06/28/2020 5.01  4.22 - 5.81 MIL/uL Final  . Hemoglobin 06/28/2020 13.9  13.0 - 17.0 g/dL Final  . HCT 60/45/4098 41.2  39.0 - 52.0 % Final  . MCV 06/28/2020 82.2  80.0 - 100.0 fL Final  . MCH 06/28/2020 27.7  26.0 - 34.0 pg Final  . MCHC 06/28/2020 33.7  30.0 - 36.0 g/dL Final  . RDW 11/91/4782 13.9  11.5 - 15.5 % Final  . Platelets 06/28/2020 315  150 - 400 K/uL Final  . nRBC 06/28/2020 0.0  0.0 - 0.2 % Final    Performed at [facility], [address] [place], [place] [address]  . Troponin I (High Sensitivity) 06/28/2020 4  <18 ng/L Final   Comment: (NOTE) Elevated high sensitivity troponin I (hsTnI) values and significant  changes across serial measurements may suggest ACS but many other  chronic and acute conditions are known to elevate hsTnI results.  Refer to the "Links" section for chest pain algorithms and additional  guidance. Performed at [facility], [address] [place], [place] [address]   . Alcohol, Ethyl (B) 06/28/2020 110* <10 mg/dL Final   Comment: (NOTE) Lowest detectable limit for serum alcohol is 10 mg/dL.  For medical purposes only. Performed at [facility], [address] [place], [place] [address]   . Salicylate Lvl 06/28/2020 <7.0* 7.0 - 30.0 mg/dL Final   Performed at [facility], [address] [place], [place] [address]  . Acetaminophen (Tylenol), Serum 06/28/2020 <10* 10 - 30 ug/mL Final   Comment: (NOTE) Therapeutic concentrations vary significantly. A range of 10-30 ug/mL  may be an effective concentration for many patients. However, some  are best treated at concentrations outside of this range. Acetaminophen concentrations >150 ug/mL at 4 hours after ingestion  and >50 ug/mL at 12 hours after ingestion are often associated with  toxic reactions.  Performed at [facility], [address], [place], [place] [address]   . Opiates 06/28/2020 NONE DETECTED  NONE DETECTED Final  . Cocaine 06/28/2020 POSITIVE* NONE DETECTED Final  . Benzodiazepines 06/28/2020 NONE DETECTED  NONE DETECTED Final  . Amphetamines 06/28/2020 NONE DETECTED  NONE DETECTED Final  . Tetrahydrocannabinol 06/28/2020 POSITIVE* NONE DETECTED Final  . Barbiturates 06/28/2020 NONE DETECTED  NONE DETECTED Final   Comment: (NOTE) DRUG SCREEN FOR MEDICAL PURPOSES ONLY.  IF CONFIRMATION IS NEEDED FOR ANY PURPOSE, NOTIFY LAB WITHIN 5 DAYS.  LOWEST  DETECTABLE LIMITS FOR URINE DRUG SCREEN Drug Class                     Cutoff (ng/mL) Amphetamine and metabolites    1000 Barbiturate and metabolites    200 Benzodiazepine                 200 Tricyclics and metabolites     300 Opiates and metabolites        300 Cocaine and metabolites        300 THC                            50 Performed at [facility], [address] [place], [place] [address]   . SARS Coronavirus 2 by RT PCR 06/28/2020 NEGATIVE  NEGATIVE Final   Comment: (NOTE) SARS-CoV-2 target nucleic acids are NOT DETECTED.  The SARS-CoV-2 RNA is generally detectable in upper respiratoy specimens during the acute phase of infection. The lowest concentration of SARS-CoV-2 viral copies this assay can detect is 131 copies/mL. A negative result does not preclude SARS-Cov-2 infection and should not be used as the sole basis for treatment or other patient management decisions. A negative result may occur with  improper specimen collection/handling, submission of specimen other than nasopharyngeal swab, presence of viral mutation(s) within the areas targeted by this assay, and inadequate number of viral copies (<131 copies/mL). A negative result must be combined with clinical observations, patient history, and epidemiological information. The expected result is Negative.  Fact Sheet for Patients:  https://www.moore.com/  Fact Sheet for Healthcare Providers:  https://www.young.biz/  This test is no                          t yet approved or cleared by the [place] FDA and  has been authorized for detection and/or diagnosis of SARS-CoV-2 by FDA under an Emergency Use Authorization (EUA). This EUA will remain  in effect (meaning this test can be used) for the duration of the COVID-19 declaration under Section 564(b)(1) of the Act, 21 U.S.C. section 360bbb-3(b)(1), unless the authorization is terminated or revoked sooner.     . Influenza A by PCR 06/28/2020 NEGATIVE  NEGATIVE Final  . Influenza B by PCR 06/28/2020 NEGATIVE  NEGATIVE Final   Comment: (NOTE) The Xpert Xpress SARS-CoV-2/FLU/RSV assay is intended as an aid in  the diagnosis of influenza from Nasopharyngeal swab specimens and  should not be used as a sole basis for treatment. Nasal washings and  aspirates are unacceptable for Xpert Xpress SARS-CoV-2/FLU/RSV  testing.  Fact Sheet for Patients: https://www.moore.com/  Fact Sheet for Healthcare Providers: https://www.young.biz/  This test is not yet approved or cleared by the [place] FDA and  has been authorized for detection and/or diagnosis of SARS-CoV-2 by  FDA under an Emergency Use Authorization (EUA). This EUA will remain  in effect (meaning this test can be used) for the duration of the  Covid-19 declaration under Section 564(b)(1) of the Act, 21  U.S.C. section 360bbb-3(b)(1), unless the authorization is  terminated or revoked. Performed at [facility], [address] [place], [place] [address]   . Troponin I (High Sensitivity) 06/28/2020 3  <18 ng/L Final   Comment: (NOTE) Elevated high sensitivity troponin I (hsTnI) values and significant  changes across serial measurements may suggest ACS but many other  chronic and acute conditions are known to elevate hsTnI results.  Refer to the "Links" section for chest pain algorithms and additional  guidance. Performed at [facility], [address], [place], [place] [address]   Appointment on 05/28/2020  Component Date Value Ref Range Status  . Glucose, Bld 05/28/2020 118* 65 - 99 mg/dL Final   Comment: .            Fasting reference interval . For someone without known diabetes, a glucose value between 100 and 125 mg/dL is consistent with prediabetes and should be confirmed with a follow-up test. .   . BUN 05/28/2020 11  7 - 25 mg/dL Final  . Creat 91/47/8295 1.37* 0.60  - 1.35 mg/dL Final  . BUN/Creatinine Ratio 05/28/2020 8  6 -  22 (calc) Final  . Sodium 05/28/2020 136  135 - 146 mmol/L Final  . Potassium 05/28/2020 4.3  3.5 - 5.3 mmol/L Final  . Chloride 05/28/2020 101  98 - 110 mmol/L Final  . CO2 05/28/2020 27  20 - 32 mmol/L Final  . Calcium 05/28/2020 9.7  8.6 - 10.3 mg/dL Final  . Total Protein 05/28/2020 7.0  6.1 - 8.1 g/dL Final  . Albumin 01/02/725310/20/2021 4.7  3.6 - 5.1 g/dL Final  . Globulin 66/44/034710/20/2021 2.3  1.9 - 3.7 g/dL (calc) Final  . AG Ratio 05/28/2020 2.0  1.0 - 2.5 (calc) Final  . Total Bilirubin 05/28/2020 0.4  0.2 - 1.2 mg/dL Final  . Alkaline phosphatase (APISO) 05/28/2020 67  36 - 130 U/L Final  . AST 05/28/2020 21  10 - 40 U/L Final  . ALT 05/28/2020 20  9 - 46 U/L Final  . CD4 T Cell Abs 05/28/2020 903  400 - 1,790 /uL Final  . CD4 % Helper T Cell 05/28/2020 55  33 - 65 % Final   Performed at [facility], [address] [place], [place] [id number]  . HIV 1 RNA Quant 05/28/2020 <20* Copies/mL Final   HIV-1 RNA Detected  . HIV-1 RNA Quant, Log 05/28/2020 <1.30* Log cps/mL Final   Comment: HIV-1 RNA Detected . HIV-1 RNA was detected below 20 copies/mL. Viral nucleic acid detected below this level cannot be quantified by the assay. . . Reference Range:                           Not Detected     copies/mL                           Not Detected Log copies/mL . Marland Kitchen. The test was performed using Real-Time Polymerase Chain Reaction. . . Reportable Range: 20 copies/mL to 10,000,000 copies/mL (1.30 Log copies/mL to 7.00 Log copies/mL). .     Allergies: Patient has no known allergies.  PTA Medications: (Not in a hospital admission)   Medical Decision Making  Admit to [facility] for continuous assessment and stabilization while [facility].  Reviewed labs Restarted Lexapro from 5mg  to 10mg  Daily for Depression       Recommendations  Based on my evaluation the patient does not appear to  have an emergency medical condition.  Maricela BoEne A Luccas Towell, NP 10/20/20  5:30 AM

## 2020-10-21 ENCOUNTER — Telehealth (HOSPITAL_COMMUNITY): Payer: Self-pay | Admitting: General Practice

## 2020-10-21 NOTE — Telephone Encounter (Signed)
Care Management - Follow Up [facility] Discharges   Writer attempted to make contact with patient today and was unsuccessful.  Writer was able to leave a HIPPA compliant voice message and will await callback.   

## 2020-11-03 ENCOUNTER — Encounter (HOSPITAL_COMMUNITY): Payer: Self-pay | Admitting: Psychiatry

## 2020-11-03 ENCOUNTER — Other Ambulatory Visit: Payer: Self-pay

## 2020-11-03 ENCOUNTER — Other Ambulatory Visit: Payer: Self-pay | Admitting: Psychiatry

## 2020-11-03 ENCOUNTER — Ambulatory Visit (INDEPENDENT_AMBULATORY_CARE_PROVIDER_SITE_OTHER): Payer: No Payment, Other | Admitting: Psychiatry

## 2020-11-03 VITALS — BP 148/108 | HR 99 | Ht 68.0 in | Wt 212.0 lb

## 2020-11-03 DIAGNOSIS — F1994 Other psychoactive substance use, unspecified with psychoactive substance-induced mood disorder: Secondary | ICD-10-CM

## 2020-11-03 DIAGNOSIS — F4310 Post-traumatic stress disorder, unspecified: Secondary | ICD-10-CM | POA: Insufficient documentation

## 2020-11-03 DIAGNOSIS — F411 Generalized anxiety disorder: Secondary | ICD-10-CM | POA: Diagnosis not present

## 2020-11-03 MED ORDER — ARIPIPRAZOLE 5 MG PO TABS: 5.0000 mg | ORAL_TABLET | Freq: Every day | ORAL | 2 refills | Status: DC

## 2020-11-03 MED ORDER — ESCITALOPRAM OXALATE 10 MG PO TABS: 10.0000 mg | ORAL_TABLET | Freq: Every day | ORAL | 2 refills | Status: DC

## 2020-11-03 MED FILL — ESCITALOPRAM 10 MG TABLET: 10 | 30 days supply | Qty: 30 | Fill #0

## 2020-11-03 MED FILL — ARIPiprazole 5 MG TABS: 5 | 30 days supply | Qty: 30 | Fill #0
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
## 2020-11-27 ENCOUNTER — Other Ambulatory Visit: Payer: Self-pay

## 2020-11-27 ENCOUNTER — Encounter (HOSPITAL_COMMUNITY): Payer: Self-pay | Admitting: Emergency Medicine

## 2020-11-27 ENCOUNTER — Ambulatory Visit (HOSPITAL_COMMUNITY)
Admission: EM | Admit: 2020-11-27 | Discharge: 2020-11-27 | Disposition: A | Discharge status: Home / Self Care | Payer: Self-pay | Attending: Student | Admitting: Student

## 2020-11-27 DIAGNOSIS — K047 Periapical abscess without sinus: Secondary | ICD-10-CM

## 2020-11-27 DIAGNOSIS — B20 Human immunodeficiency virus [HIV] disease: Secondary | ICD-10-CM

## 2020-11-27 DIAGNOSIS — J029 Acute pharyngitis, unspecified: Secondary | ICD-10-CM

## 2020-11-27 MED ORDER — IBUPROFEN 400 MG PO TABS: 400.0000 mg | ORAL_TABLET | Freq: Three times a day (TID) | ORAL | 0 refills | Status: DC | PRN

## 2020-11-27 MED ORDER — AMOXICILLIN-POT CLAVULANATE 875-125 MG PO TABS: 1.0000 | ORAL_TABLET | Freq: Two times a day (BID) | ORAL | 0 refills | Status: DC

## 2020-11-27 MED ORDER — LIDOCAINE VISCOUS HCL 2 % MT SOLN: 15.0000 mL | OROMUCOSAL | 0 refills | Status: DC | PRN

## 2020-11-27 NOTE — ED Provider Notes (Signed)
[facility]    CSN: [id number] Arrival date & time: 11/27/20  [id number]      History   Chief Complaint Chief Complaint  Patient presents with  . Dental Pain  . Sore Throat    HPI Allen PlanVictor Pangborn Jr. is a 35 y.o. male presenting for dental infection and sore throat.  Medical history HIV, anxiety, GERD, hypertension.  Notes 2 days of left lower wisdom tooth pain with swelling around it.  States he has had dental infections before related to his impacted wisdom teeth.  Denies foul taste in mouth, pain of roof or floor of mouth, fever/chills, foul taste in mouth.  He also notes a sore throat for 4 hours since waking up today.  Denies cough, nasal congestion, fever/chills, nausea/vomiting/diarrhea.  HPI  Past Medical History:  Diagnosis Date  . Anxiety   . COVID   . Fracture of metacarpal base of right hand, closed 11/2015   right small finger  . GERD (gastroesophageal reflux disease)    no current med.  Marland Kitchen. HIV (human immunodeficiency virus infection) (HCC)   . Hypertension     Patient Active Problem List   Diagnosis Date Noted  . Substance induced mood disorder (HCC) 11/03/2020  . Generalized anxiety disorder 11/03/2020  . PTSD (post-traumatic stress disorder) 11/03/2020  . Nasal congestion 02/28/2020  . Gastroesophageal reflux 02/28/2020  . Human immunodeficiency virus (HIV) disease (HCC) 03/20/2019  . Healthcare maintenance 03/20/2019  . Mild intermittent asthma without complication 03/20/2019  . Essential hypertension 03/20/2019  . Anxiety 03/20/2019    Past Surgical History:  Procedure Laterality Date  . NO PAST SURGERIES    . OPEN REDUCTION INTERNAL FIXATION (ORIF) METACARPAL Right 12/09/2015   Procedure: OPEN REDUCTION INTERNAL FIXATION (ORIF) RIGHT SMALL FINGER/METACARPAL;  Surgeon: Betha LoaKevin Kuzma, MD;  Location: [facility];  Service: Orthopedics;  Laterality: Right;       Home Medications    Prior to Admission medications   Medication Sig  Start Date End Date Taking? Authorizing Provider  amoxicillin-clavulanate (AUGMENTIN) 875-125 MG tablet Take 1 tablet by mouth every 12 (twelve) hours. 11/27/20  Yes Rhys MartiniGraham, Erum Cercone E, PA-C  ARIPiprazole (ABILIFY) 5 MG tablet Take 1 tablet (5 mg total) by mouth daily. 11/03/20  Yes Toy CookeyParsons, Brittney E, NP  BIKTARVY 50-200-25 MG TABS tablet Take 1 tablet by mouth daily. 10/06/20  Yes Veryl Speakalone, Gregory D, FNP  escitalopram (LEXAPRO) 10 MG tablet Take 1 tablet (10 mg total) by mouth daily. 11/03/20  Yes Toy CookeyParsons, Brittney E, NP  ibuprofen (ADVIL) 400 MG tablet Take 1 tablet (400 mg total) by mouth every 8 (eight) hours as needed. 11/27/20  Yes Rhys MartiniGraham, Shiro Ellerman E, PA-C  lidocaine (XYLOCAINE) 2 % solution Use as directed 15 mLs in the mouth or throat as needed for mouth pain. 11/27/20  Yes Rhys MartiniGraham, Lillard Bailon E, PA-C  hydrochlorothiazide (MICROZIDE) 12.5 MG capsule TAKE 1 CAPSULE(12.5 MG) BY MOUTH DAILY Patient taking differently: Take 12.5 mg by mouth daily. 10/06/20 11/27/20 Yes Veryl Speakalone, Gregory D, FNP  albuterol (VENTOLIN HFA) 108 (90 Base) MCG/ACT inhaler Inhale 2 puffs into the lungs every 6 (six) hours as needed for wheezing or shortness of breath. 12/21/19   Wallis BambergMani, Mario, PA-C  ARIPiprazole (ABILIFY) 5 MG tablet TAKE 1 TABLET (5 MG TOTAL) BY MOUTH DAILY. 11/03/20 11/03/21  Shanna CiscoParsons, Brittney E, NP  cetirizine (ZYRTEC ALLERGY) 10 MG tablet Take 1 tablet (10 mg total) by mouth daily. 12/21/19   Wallis BambergMani, Mario, PA-C  escitalopram (LEXAPRO) 10 MG tablet TAKE 1  TABLET (10 MG TOTAL) BY MOUTH DAILY. 11/03/20 11/03/21  Shanna Cisco, NP  famotidine (PEPCID) 20 MG tablet Take 1 tablet (20 mg total) by mouth 2 (two) times daily. Patient taking differently: Take 20 mg by mouth daily. 02/28/20 11/27/20  Veryl Speak, FNP    Family History Family History  Problem Relation Age of Onset  . Cancer Mother     Social History Social History   Tobacco Use  . Smoking status: Current Every Day Smoker    Packs/day: 0.50    Years:  10.00    Pack years: 5.00    Types: Cigarettes  . Smokeless tobacco: Never Used  Vaping Use  . Vaping Use: Never used  Substance Use Topics  . Alcohol use: Yes    Alcohol/week: 1.0 standard drink    Types: 1 Cans of beer per week    Comment: socially  . Drug use: Yes    Types: Marijuana, Cocaine    Comment: marijuana daily      Allergies   Patient has no known allergies.   Review of Systems Review of Systems  Constitutional: Negative for appetite change, chills and fever.  HENT: Positive for dental problem and sore throat. Negative for congestion, ear pain, rhinorrhea, sinus pressure, sinus pain, tinnitus, trouble swallowing and voice change.   Eyes: Negative for redness and visual disturbance.  Respiratory: Negative for cough, chest tightness, shortness of breath and wheezing.   Cardiovascular: Negative for chest pain and palpitations.  Gastrointestinal: Negative for abdominal pain, constipation, diarrhea, nausea and vomiting.  Genitourinary: Negative for dysuria, frequency and urgency.  Musculoskeletal: Negative for myalgias.  Neurological: Negative for dizziness, weakness and headaches.  Psychiatric/Behavioral: Negative for confusion.  All other systems reviewed and are negative.    Physical Exam Triage Vital Signs ED Triage Vitals  Enc Vitals Group     BP 11/27/20 1014 128/84     Pulse Rate 11/27/20 1014 92     Resp 11/27/20 1014 17     Temp 11/27/20 1014 97.8 F (36.6 C)     Temp Source 11/27/20 1014 Oral     SpO2 11/27/20 1014 98 %     Weight --      Height --      Head Circumference --      Peak Flow --      Pain Score 11/27/20 1011 8     Pain Loc --      Pain Edu? --      Excl. in GC? --    No data found.  Updated Vital Signs BP 128/84 (BP Location: Right Arm)   Pulse 92   Temp 97.8 F (36.6 C) (Oral)   Resp 17   SpO2 98%   Visual Acuity Right Eye Distance:   Left Eye Distance:   Bilateral Distance:    Right Eye Near:   Left Eye Near:     Bilateral Near:     Physical Exam Vitals reviewed.  Constitutional:      General: He is not in acute distress.    Appearance: Normal appearance. He is not ill-appearing, toxic-appearing or diaphoretic.  HENT:     Head: Normocephalic and atraumatic.     Jaw: There is normal jaw occlusion. No trismus, tenderness, swelling, pain on movement or malocclusion.     Salivary Glands: Right salivary gland is not diffusely enlarged or tender. Left salivary gland is not diffusely enlarged or tender.     Right Ear: Hearing, tympanic membrane, ear canal  and external ear normal. No swelling or tenderness. There is no impacted cerumen. No mastoid tenderness. Tympanic membrane is not perforated, erythematous, retracted or bulging.     Left Ear: Hearing, tympanic membrane, ear canal and external ear normal. No swelling or tenderness. There is no impacted cerumen. No mastoid tenderness. Tympanic membrane is not perforated, erythematous, retracted or bulging.     Nose: Nose normal.     Right Sinus: No maxillary sinus tenderness or frontal sinus tenderness.     Left Sinus: No maxillary sinus tenderness or frontal sinus tenderness.     Mouth/Throat:     Lips: Pink.     Mouth: Mucous membranes are moist. No lacerations or oral lesions.     Dentition: Abnormal dentition. Does not have dentures. Dental tenderness and dental caries present.     Tongue: No lesions. Tongue does not deviate from midline.     Palate: No mass.     Pharynx: Oropharynx is clear. Uvula midline. No oropharyngeal exudate or posterior oropharyngeal erythema.     Tonsils: No tonsillar exudate or tonsillar abscesses.     Comments: Poor dentician  Swelling and erythema surrounding L lower wisdom tooth  Eyes:     Extraocular Movements: Extraocular movements intact.     Pupils: Pupils are equal, round, and reactive to light.  Cardiovascular:     Rate and Rhythm: Normal rate and regular rhythm.     Heart sounds: Normal heart sounds.   Pulmonary:     Effort: Pulmonary effort is normal.     Breath sounds: Normal breath sounds and air entry. No wheezing, rhonchi or rales.  Chest:     Chest wall: No tenderness.  Abdominal:     General: Abdomen is flat. Bowel sounds are normal.     Tenderness: There is no abdominal tenderness. There is no guarding or rebound.  Lymphadenopathy:     Cervical: No cervical adenopathy.  Neurological:     General: No focal deficit present.     Mental Status: He is alert and oriented to person, place, and time.  Psychiatric:        Attention and Perception: Attention and perception normal.        Mood and Affect: Mood and affect normal.        Behavior: Behavior normal. Behavior is cooperative.        Thought Content: Thought content normal.        Judgment: Judgment normal.      UC Treatments / Results  Labs (all labs ordered are listed, but only abnormal results are displayed) Labs Reviewed - No data to display  EKG   Radiology No results found.  Procedures Procedures (including critical care time)  Medications Ordered in UC Medications - No data to display  Initial Impression / Assessment and Plan / UC Course  I have reviewed the triage vital signs and the nursing notes.  Pertinent labs & imaging results that were available during my care of the patient were reviewed by me and considered in my medical decision making (see chart for details).     This patient is a 35 year old male presenting for dental infection, viral pharyngitis, HIV disease.  On exam, he does have swelling and tenderness around his left lower wisdom tooth consistent with dental infection; Augmentin as below.  He does have HIV disease, but this is well controlled on current regimen.  He also notes 1 day of sore throat, which I believed to be viral in nature; will defer  strep test as Augmentin would cover for this. ED return precautions discussed.  He is awaiting appointment with oral surgeon sent by PCP.  No trismus, normal phonation, no trouble handling secretions. Very low concern for ludwig's angina.   Final Clinical Impressions(s) / UC Diagnoses   Final diagnoses:  Dental infection  Viral pharyngitis  HIV disease (HCC)     Discharge Instructions     -Start the antibiotic-Augmentin (amoxicillin-clavulanate), 1 pill every 12 hours for 7 days.  You can take this with food like with breakfast and dinner. -For sore throat and dental pain, use lidocaine mouthwash up to every 4 hours. Make sure not to eat for at least 1 hour after using this, as your mouth will be very numb and you could bite yourself. -You can also take ibuprofen up to every 8 hours with food for further relief. Avoid other NSAIDs while on this medication. You can still take tylenol. -Seek additional medical attention if your symptoms worsen or persist like worsening of swelling, new fevers/chills, trouble swallowing, dizziness, etc.     ED Prescriptions    Medication Sig Dispense Auth. Provider   amoxicillin-clavulanate (AUGMENTIN) 875-125 MG tablet Take 1 tablet by mouth every 12 (twelve) hours. 14 tablet Ignacia Bayley E, PA-C   lidocaine (XYLOCAINE) 2 % solution Use as directed 15 mLs in the mouth or throat as needed for mouth pain. 100 mL Rhys Martini, PA-C   ibuprofen (ADVIL) 400 MG tablet Take 1 tablet (400 mg total) by mouth every 8 (eight) hours as needed. 30 tablet Rhys Martini, PA-C     PDMP not reviewed this encounter.   Rhys Martini, PA-C 11/27/20 1037

## 2020-11-27 NOTE — Discharge Instructions (Addendum)
-  Start the antibiotic-Augmentin (amoxicillin-clavulanate), 1 pill every 12 hours for 7 days.  You can take this with food like with breakfast and dinner. -For sore throat and dental pain, use lidocaine mouthwash up to every 4 hours. Make sure not to eat for at least 1 hour after using this, as your mouth will be very numb and you could bite yourself. -You can also take ibuprofen up to every 8 hours with food for further relief. Avoid other NSAIDs while on this medication. You can still take tylenol. -Seek additional medical attention if your symptoms worsen or persist like worsening of swelling, new fevers/chills, trouble swallowing, dizziness, etc.

## 2020-11-27 NOTE — ED Triage Notes (Signed)
Pt presents with left side dental pain xs 2 days. States unable to eat on left side of mouth and has sore throat.   States took tylenol this am with no relief.

## 2020-12-18 ENCOUNTER — Other Ambulatory Visit: Payer: Self-pay

## 2020-12-18 MED FILL — Escitalopram Oxalate Tab 10 MG (Base Equiv): ORAL | 30 days supply | Qty: 30 | Fill #0 | Status: CN

## 2020-12-18 MED FILL — Aripiprazole Tab 5 MG: ORAL | 30 days supply | Qty: 30 | Fill #0 | Status: CN

## 2020-12-19 ENCOUNTER — Encounter (HOSPITAL_COMMUNITY): Payer: Self-pay

## 2020-12-19 ENCOUNTER — Emergency Department (HOSPITAL_COMMUNITY)
Admission: EM | Admit: 2020-12-19 | Discharge: 2020-12-19 | Disposition: A | Discharge status: Home / Self Care | Payer: Self-pay | Attending: Emergency Medicine | Admitting: Emergency Medicine

## 2020-12-19 ENCOUNTER — Emergency Department (HOSPITAL_COMMUNITY): Payer: Self-pay

## 2020-12-19 ENCOUNTER — Other Ambulatory Visit: Payer: Self-pay

## 2020-12-19 ENCOUNTER — Telehealth: Payer: Self-pay

## 2020-12-19 DIAGNOSIS — R0789 Other chest pain: Secondary | ICD-10-CM | POA: Insufficient documentation

## 2020-12-19 DIAGNOSIS — R002 Palpitations: Secondary | ICD-10-CM | POA: Insufficient documentation

## 2020-12-19 DIAGNOSIS — R0602 Shortness of breath: Secondary | ICD-10-CM

## 2020-12-19 DIAGNOSIS — R0601 Orthopnea: Secondary | ICD-10-CM | POA: Insufficient documentation

## 2020-12-19 DIAGNOSIS — G4730 Sleep apnea, unspecified: Secondary | ICD-10-CM | POA: Insufficient documentation

## 2020-12-19 NOTE — ED Provider Notes (Signed)
Emergency Medicine Provider Triage Evaluation Note  Allen [name]. , a 35 y.o. male  was evaluated in triage.  Pt complains of palpitations, shortness of breath, orthopnea.  Patient reports he believes that he has sleep apnea but has never had a sleep test or a formal diagnosis.  He reports that other people tell him that he snores at night.  He states that for the last several months he has woken up at night panicking that he cannot breathe.  He states over the last 3 days this has worsened significantly and he awakes with palpitations and severe shortness of breath.  He reports that even when he is not asleep he has shortness of breath when he lies flat.  Patient reports he has been feeling poorly for the last several days.  Attempted to schedule sleep study but does not have a primary care physician and has not obtained a referral for same.  Denies peripheral edema.  Review of Systems  Positive: Palpitations, shortness of breath, chest discomfort Negative: Fever, chills, leg swelling  Physical Exam  BP (!) 138/100 (BP Location: Right Arm)   Pulse (!) 102   Temp 98.3 F (36.8 C) (Oral)   Resp 18   Ht 5\' 8"  (1.727 m)   Wt 96.6 kg   SpO2 98%   BMI 32.39 kg/m    Gen:   Awake, no distress, anxious Resp:  Normal effort  MSK:   Moves extremities without difficulty, no peripheral edema Other:  Tachycardia  Medical Decision Making  Medically screening exam initiated at 6:25 AM.  Appropriate orders placed.  Allen [name]. was informed that the remainder of the evaluation will be completed by another provider, this initial triage assessment does not replace that evaluation, and the importance of remaining in the ED until their evaluation is complete.  Patient with complaints of sleep apnea however has no formal diagnosis.  Does have a history of HIV, hypertension, anxiety and GERD.  Concern for possible CHF, PE, infection in addition to sleep apnea and anxiety.  Work-up  pending.  Shortness of breath    [name] 12/19/20 12/21/20    [id number], MD 12/20/20 [phone]

## 2020-12-19 NOTE — ED Notes (Signed)
Patient called for in waiting room but did not answer

## 2020-12-19 NOTE — ED Notes (Signed)
Patient called for in lobby but was not there

## 2020-12-19 NOTE — Telephone Encounter (Signed)
I am fine with placing an order for a sleep study. What's the best way to get this done as he has UMAP. Is that going to cause a problem?

## 2020-12-19 NOTE — ED Triage Notes (Signed)
Pt to ED by EMS from home with c/o sleep apnea which caused a panic attack. Pt has been trying to get a sleep study done, but needs some resources and a referral to do so. Pt endorses waking up around 0500 in a state of panic due to sleep apnea. Arrives A+O, VS, NADN.

## 2020-12-19 NOTE — ED Notes (Signed)
Patient called for in lobby but no response

## 2020-12-19 NOTE — Telephone Encounter (Signed)
Patient called office requesting order to for sleep study. States he was seen at [facility] ED today after waking up with SOB. Patient does not have a PCP and would like to know if [facility], FNP can put order in.  Provided patient number for Internal Medicine to establish primary care. Will forward message to [place],  FNP. Juanita Laster, RMA

## 2020-12-25 ENCOUNTER — Other Ambulatory Visit: Payer: Self-pay

## 2021-01-27 ENCOUNTER — Telehealth (INDEPENDENT_AMBULATORY_CARE_PROVIDER_SITE_OTHER): Payer: No Payment, Other | Admitting: Psychiatry

## 2021-01-27 ENCOUNTER — Other Ambulatory Visit: Payer: Self-pay

## 2021-01-27 ENCOUNTER — Encounter (HOSPITAL_COMMUNITY): Payer: Self-pay | Admitting: Psychiatry

## 2021-01-27 DIAGNOSIS — F411 Generalized anxiety disorder: Secondary | ICD-10-CM | POA: Diagnosis not present

## 2021-01-27 DIAGNOSIS — F1994 Other psychoactive substance use, unspecified with psychoactive substance-induced mood disorder: Secondary | ICD-10-CM

## 2021-01-27 DIAGNOSIS — F4310 Post-traumatic stress disorder, unspecified: Secondary | ICD-10-CM

## 2021-01-27 NOTE — Progress Notes (Signed)
BH MD/PA/NP OP Progress Note Virtual Visit via Telephone Note  I connected with Allen [name]. on 01/27/21 at  1:00 PM EDT by telephone and verified that I am speaking with the correct person using two identifiers.  Location: Patient: Work Provider: Clinic   I discussed the limitations, risks, security and privacy concerns of performing an evaluation and management service by telephone and the availability of in person appointments. I also discussed with the patient that there may be a patient responsible charge related to this service. The patient expressed understanding and agreed to proceed.   I provided 20 minutes of non-face-to-face time during this encounter.  01/27/2021 1:20 PM [name].  MRN:  [id number]  Chief Complaint: "I haven't had my medications in a week. I don't need them"  HPI: 35 year old male seen today for follow up psychiatric evaluation.  He has a psychiatric history of substance-induced mood disorder, anxiety, PTSD, and SI.  He is currently managed on Abilify 5 mg daily and Lexapro 10 mg daily.  Patient notes that he ran out of his medications a week ago and currently does not want to restart them.  Today patient unable to logon virtually so assessment done over the phone.  During exam he was pleasant, cooperative, engaged in conversation.  He informed provider that he was at work and notes that he did not have much time to speak.  Today he denies SI/HI/VH, mania, or paranoia.  He notes that his anxiety and depression are minimal and reports that he no longer thinks he needs his medications.  He notes he has been feeling fine without them.  He endorses having adequate appetite and sleep.  No medications refilled or prescribed today.  Patient notes that he will follow-up if needed.  No other concerns at this time.    Visit Diagnosis:    ICD-10-CM   1. Substance induced mood disorder (HCC)  F19.94     2. Generalized anxiety disorder  F41.1     3.  PTSD (post-traumatic stress disorder)  F43.10       Past Psychiatric History: substance-induced mood disorder, anxiety, PTSD, and SI  Past Medical History:  Past Medical History:  Diagnosis Date   Anxiety    COVID    Fracture of metacarpal base of right hand, closed 11/2015   right small finger   GERD (gastroesophageal reflux disease)    no current med.   HIV (human immunodeficiency virus infection) (HCC)    Hypertension     Past Surgical History:  Procedure Laterality Date   NO PAST SURGERIES     OPEN REDUCTION INTERNAL FIXATION (ORIF) METACARPAL Right 12/09/2015   Procedure: OPEN REDUCTION INTERNAL FIXATION (ORIF) RIGHT SMALL FINGER/METACARPAL;  Surgeon: Betha Loa, MD;  Location: [facility];  Service: Orthopedics;  Laterality: Right;    Family Psychiatric History: 3 sisters bipolar disorder  Family History:  Family History  Problem Relation Age of Onset   Cancer Mother     Social History:  Social History   Socioeconomic History   Marital status: Single    Spouse name: Not on file   Number of children: Not on file   Years of education: Not on file   Highest education level: Not on file  Occupational History   Not on file  Tobacco Use   Smoking status: Every Day    Packs/day: 0.50    Years: 10.00    Pack years: 5.00    Types: Cigarettes   Smokeless tobacco:  Never  Vaping Use   Vaping Use: Never used  Substance and Sexual Activity   Alcohol use: Yes    Alcohol/week: 1.0 standard drink    Types: 1 Cans of beer per week    Comment: socially   Drug use: Yes    Types: Marijuana, Cocaine    Comment: marijuana daily    Sexual activity: Not Currently  Other Topics Concern   Not on file  Social History Narrative   Not on file   Social Determinants of Health   Financial Resource Strain: Not on file  Food Insecurity: Not on file  Transportation Needs: Not on file  Physical Activity: Not on file  Stress: Not on file  Social Connections: Not  on file    Allergies: No Known Allergies  Metabolic Disorder Labs: No results found for: HGBA1C, MPG No results found for: PROLACTIN Lab Results  Component Value Date   CHOL 149 08/28/2020   TRIG 108 08/28/2020   HDL 45 08/28/2020   CHOLHDL 3.3 08/28/2020   LDLCALC 84 08/28/2020   LDLCALC 91 03/01/2019   No results found for: TSH  Therapeutic Level Labs: No results found for: LITHIUM No results found for: VALPROATE No components found for:  CBMZ  Current Medications: Current Outpatient Medications  Medication Sig Dispense Refill   albuterol (VENTOLIN HFA) 108 (90 Base) MCG/ACT inhaler Inhale 2 puffs into the lungs every 6 (six) hours as needed for wheezing or shortness of breath. 18 g 0   amoxicillin-clavulanate (AUGMENTIN) 875-125 MG tablet Take 1 tablet by mouth every 12 (twelve) hours. 14 tablet 0   BIKTARVY 50-200-25 MG TABS tablet Take 1 tablet by mouth daily. 30 tablet 5   cetirizine (ZYRTEC ALLERGY) 10 MG tablet Take 1 tablet (10 mg total) by mouth daily. 30 tablet 0   ibuprofen (ADVIL) 400 MG tablet Take 1 tablet (400 mg total) by mouth every 8 (eight) hours as needed. 30 tablet 0   lidocaine (XYLOCAINE) 2 % solution Use as directed 15 mLs in the mouth or throat as needed for mouth pain. 100 mL 0   No current facility-administered medications for this visit.     Musculoskeletal: Strength & Muscle Tone:  Unable to assess due to telephone visit Gait & Station:   Unable to assess due to telephone visit Patient leans: N/A  Psychiatric Specialty Exam: Review of Systems  There were no vitals taken for this visit.There is no height or weight on file to calculate BMI.  General Appearance:   Unable to assess due to telephone visit  Eye Contact:    Unable to assess due to telephone visit  Speech:  Clear and Coherent and Normal Rate  Volume:  Normal  Mood:  Euthymic  Affect:  Appropriate and Congruent  Thought Process:  Coherent, Goal Directed, and Linear   Orientation:  Full (Time, Place, and Person)  Thought Content: WDL and Logical   Suicidal Thoughts:  No  Homicidal Thoughts:  No  Memory:  Immediate;   Good Recent;   Good Remote;   Good  Judgement:  Good  Insight:  Good  Psychomotor Activity:  Normal  Concentration:  Concentration: Good and Attention Span: Good  Recall:  Good  Fund of Knowledge: Good  Language: Good  Akathisia:  No  Handed:  Right  AIMS (if indicated): not done  Assets:  Communication Skills Desire for Improvement Financial Resources/Insurance Housing Physical Health Social Support  ADL's:  Intact  Cognition: WNL  Sleep:  Good  Screenings: AUDIT    Flowsheet Row Clinical Support from 11/03/2020 in [facility]  Alcohol Use Disorder Identification Test Final Score (AUDIT) 7      GAD-7    Flowsheet Row Clinical Support from 11/03/2020 in [facility]  Total GAD-7 Score 14      PHQ2-9    Flowsheet Row Clinical Support from 11/03/2020 in [facility] Office Visit from 10/06/2020 in [facility] Video Visit from 02/28/2020 in [facility] for [facility] Disease Office Visit from 03/20/2019 in [facility] for Infectious Disease  PHQ-2 Total Score 5 0 0 0  PHQ-9 Total Score 14 -- -- --      Flowsheet Row ED from 12/19/2020 in [facility] from 11/27/2020 in [facility] Clinical Support from 11/03/2020 in [facility]  C-SSRS RISK CATEGORY No Risk Error: Question 6 not populated Error: Q3, 4, or 5 should not be populated when Q2 is No        Assessment and Plan: Patient notes that he discontinued his medications after running out a weeks ago.  At this time he notes that he does not want to restart medication.  He will follow-up as needed.   1. Substance induced mood  disorder (HCC)   2. Generalized anxiety disorder   3. PTSD (post-traumatic stress disorder)   Follow-up as needed Follow-up therapy  Shanna Cisco, NP 01/27/2021, 1:20 PM

## 2021-02-03 ENCOUNTER — Other Ambulatory Visit: Payer: Self-pay

## 2021-02-03 DIAGNOSIS — Z79899 Other long term (current) drug therapy: Secondary | ICD-10-CM

## 2021-02-03 DIAGNOSIS — Z113 Encounter for screening for infections with a predominantly sexual mode of transmission: Secondary | ICD-10-CM

## 2021-02-03 DIAGNOSIS — B20 Human immunodeficiency virus [HIV] disease: Secondary | ICD-10-CM

## 2021-02-04 LAB — T-HELPER CELL (CD4) - (RCID CLINIC ONLY)
CD4 % Helper T Cell: 48 % (ref 33–65)
CD4 T Cell Abs: 767 /uL (ref 400–1790)

## 2021-02-06 LAB — COMPREHENSIVE METABOLIC PANEL
AG Ratio: 1.8 (calc) (ref 1.0–2.5)
ALT: 20 U/L (ref 9–46)
AST: 16 U/L (ref 10–40)
Albumin: 4.9 g/dL (ref 3.6–5.1)
Alkaline phosphatase (APISO): 72 U/L (ref 36–130)
BUN: 13 mg/dL (ref 7–25)
CO2: 30 mmol/L (ref 20–32)
Calcium: 10.1 mg/dL (ref 8.6–10.3)
Chloride: 103 mmol/L (ref 98–110)
Creat: 1.13 mg/dL (ref 0.60–1.35)
Globulin: 2.8 g/dL (calc) (ref 1.9–3.7)
Glucose, Bld: 98 mg/dL (ref 65–99)
Potassium: 4.5 mmol/L (ref 3.5–5.3)
Sodium: 139 mmol/L (ref 135–146)
Total Bilirubin: 0.5 mg/dL (ref 0.2–1.2)
Total Protein: 7.7 g/dL (ref 6.1–8.1)

## 2021-02-06 LAB — RPR TITER: RPR Titer: 1:2 {titer} — ABNORMAL HIGH

## 2021-02-06 LAB — LIPID PANEL
Cholesterol: 177 mg/dL (ref <200)
HDL: 50 mg/dL (ref >=40)
LDL Cholesterol (Calc): 104 mg/dL (calc) — ABNORMAL HIGH
Non-HDL Cholesterol (Calc): 127 mg/dL (calc) (ref <130)
Total CHOL/HDL Ratio: 3.5 (calc) (ref <5.0)
Triglycerides: 132 mg/dL (ref <150)

## 2021-02-06 LAB — HIV-1 RNA QUANT-NO REFLEX-BLD
HIV 1 RNA Quant: 21 Copies/mL — ABNORMAL HIGH
HIV-1 RNA Quant, Log: 1.32 Log cps/mL — ABNORMAL HIGH

## 2021-02-06 LAB — RPR: RPR Ser Ql: REACTIVE — AB

## 2021-02-06 LAB — FLUORESCENT TREPONEMAL AB(FTA)-IGG-BLD: Fluorescent Treponemal ABS: REACTIVE — AB

## 2021-02-17 ENCOUNTER — Ambulatory Visit (INDEPENDENT_AMBULATORY_CARE_PROVIDER_SITE_OTHER): Payer: Self-pay | Admitting: Family

## 2021-02-17 ENCOUNTER — Encounter: Payer: Self-pay | Admitting: Family

## 2021-02-17 ENCOUNTER — Other Ambulatory Visit: Payer: Self-pay

## 2021-02-17 ENCOUNTER — Ambulatory Visit: Payer: Self-pay

## 2021-02-17 VITALS — BP 117/83 | HR 98 | Temp 98.2°F | Ht 68.0 in | Wt 229.0 lb

## 2021-02-17 DIAGNOSIS — Z0000 Encounter for general adult medical examination without abnormal findings: Secondary | ICD-10-CM

## 2021-02-17 DIAGNOSIS — B20 Human immunodeficiency virus [HIV] disease: Secondary | ICD-10-CM

## 2021-02-17 MED ORDER — BIKTARVY 50-200-25 MG PO TABS: 1.0000 | ORAL_TABLET | Freq: Every day | ORAL | 5 refills | Status: DC

## 2021-02-17 NOTE — Assessment & Plan Note (Signed)
Mr. Rivet continues to have well-controlled virus with good adherence and tolerance to his ART regimen of Biktarvy.  No signs/symptoms of opportunistic infection.  We reviewed lab work and discussed plan of care.  Continue current dose of Biktarvy.  Plan for follow-up in 4 months or sooner if needed with lab work 1 to 2 weeks prior to appointment.

## 2021-02-17 NOTE — Assessment & Plan Note (Signed)
   Discussed importance of safe sexual practice to reduce risk of STI.  Condoms declined.  Due for routine dental care with information provided in after visit summary to make appointment.  Declines vaccines.  Discussed/recommended COVID vaccination/booster

## 2021-02-17 NOTE — Patient Instructions (Addendum)
Nice to see you.  Continue to take your medication daily as prescribed.  Refills have been sent to the pharmacy  Please call [facility]) to schedule/follow up on your dental care at [phone]  For primary care recommend [facility] [phone] or [facility] [phone].  Plan for follow up in 4 months or sooner if needed with lab work 1-2 weeks prior to appointment.   Have a great day and stay safe!

## 2021-02-17 NOTE — Progress Notes (Signed)
Brief Narrative   Patient ID: Allen [name]., male    DOB: [birth year], 35 y.o.   MRN: [id number]  Allen [name] is a 35 y/o AA male diagnosed with HIV in 2018 with risk factor of tattoo or heterosexual contact. Initial CD4 and viral load are unavailable. [facility] initial lab work with undetectable viral load and CD4 count of 838. HLAB 5701 negative. No history of opportunistic infection. Previous ART regimen of Prezcobix and Descovy and switched to [facility] after experiencing gastrointestinal issues.   Subjective:    Chief Complaint  Patient presents with   Follow-up     HPI:  Allen [name]. is a 35 y.o. male with HIV disease last seen on 10/06/2020 with well-controlled virus and good adherence and tolerance to his ART regimen of Biktarvy.  Viral load at the time was undetectable with CD4 count of 903.  Most recent blood work completed on 02/03/2021 with viral load remains undetectable and CD4 count of 767.  Kidney function, liver function, electrolytes within normal ranges.  Lipid profile with triglycerides of 132, HDL 50, and LDL 104.  Here today for routine follow-up.  Allen [name] continues to take his Biktarvy daily as prescribed with no adverse side effects or missed doses since his last office visit.  Overall feeling well today with no new concerns/complaints. Denies fevers, chills, night sweats, headaches, changes in vision, neck pain/stiffness, nausea, diarrhea, vomiting, lesions or rashes.  Allen [name] has no problems obtaining medication from the pharmacy and will renew financial assistance today.  Denies feelings of being down, depressed, or hopeless recently.  No recreational or illicit drug use.  Smokes approximately 1/2 pack of tobacco daily and drinks alcohol on occasion.  Due for routine dental care.  Healthcare maintenance due includes Prevnar, Pneumovax, and Menveo.  Currently working full-time in [employer].  Awaiting referral to internal medicine.  No Known  Allergies    Outpatient Medications Prior to Visit  Medication Sig Dispense Refill   albuterol (VENTOLIN HFA) 108 (90 Base) MCG/ACT inhaler Inhale 2 puffs into the lungs every 6 (six) hours as needed for wheezing or shortness of breath. 18 g 0   cetirizine (ZYRTEC ALLERGY) 10 MG tablet Take 1 tablet (10 mg total) by mouth daily. 30 tablet 0   BIKTARVY 50-200-25 MG TABS tablet Take 1 tablet by mouth daily. 30 tablet 5   ibuprofen (ADVIL) 400 MG tablet Take 1 tablet (400 mg total) by mouth every 8 (eight) hours as needed. 30 tablet 0   amoxicillin-clavulanate (AUGMENTIN) 875-125 MG tablet Take 1 tablet by mouth every 12 (twelve) hours. (Patient not taking: Reported on 02/17/2021) 14 tablet 0   lidocaine (XYLOCAINE) 2 % solution Use as directed 15 mLs in the mouth or throat as needed for mouth pain. (Patient not taking: Reported on 02/17/2021) 100 mL 0   No facility-administered medications prior to visit.     Past Medical History:  Diagnosis Date   Anxiety    COVID    Fracture of metacarpal base of right hand, closed 11/2015   right small finger   GERD (gastroesophageal reflux disease)    no current med.   HIV (human immunodeficiency virus infection) (HCC)    Hypertension      Past Surgical History:  Procedure Laterality Date   NO PAST SURGERIES     OPEN REDUCTION INTERNAL FIXATION (ORIF) METACARPAL Right 12/09/2015   Procedure: OPEN REDUCTION INTERNAL FIXATION (ORIF) RIGHT SMALL FINGER/METACARPAL;  Surgeon: Betha Loa, MD;  Location: [facility]  [place];  Service: Orthopedics;  Laterality: Right;       Review of Systems  Constitutional:  Negative for appetite change, chills, fatigue, fever and unexpected weight change.  Eyes:  Negative for visual disturbance.  Respiratory:  Negative for cough, chest tightness, shortness of breath and wheezing.   Cardiovascular:  Negative for chest pain and leg swelling.  Gastrointestinal:  Negative for abdominal pain, constipation,  diarrhea, nausea and vomiting.  Genitourinary:  Negative for dysuria, flank pain, frequency, genital sores, hematuria and urgency.  Skin:  Negative for rash.  Allergic/Immunologic: Negative for immunocompromised state.  Neurological:  Negative for dizziness and headaches.     Objective:    BP 117/83   Pulse 98   Temp 98.2 F (36.8 C) (Oral)   Ht $R'5\' 8"'CN$  (1.727 m)   Wt 229 lb (103.9 kg)   SpO2 98%   BMI 34.82 kg/m  Nursing note and vital signs reviewed.  Physical Exam Constitutional:      General: He is not in acute distress.    Appearance: He is well-developed.  Eyes:     Conjunctiva/sclera: Conjunctivae normal.  Cardiovascular:     Rate and Rhythm: Normal rate and regular rhythm.     Heart sounds: Normal heart sounds. No murmur heard.   No friction rub. No gallop.  Pulmonary:     Effort: Pulmonary effort is normal. No respiratory distress.     Breath sounds: Normal breath sounds. No wheezing or rales.  Chest:     Chest wall: No tenderness.  Abdominal:     General: Bowel sounds are normal.     Palpations: Abdomen is soft.     Tenderness: There is no abdominal tenderness.  Musculoskeletal:     Cervical back: Neck supple.  Lymphadenopathy:     Cervical: No cervical adenopathy.  Skin:    General: Skin is warm and dry.     Findings: No rash.  Neurological:     Mental Status: He is alert and oriented to person, place, and time.  Psychiatric:        Behavior: Behavior normal.        Thought Content: Thought content normal.        Judgment: Judgment normal.     Depression screen [facility] 2/9 10/06/2020 02/28/2020 03/20/2019  Decreased Interest 0 0 0  Down, Depressed, Hopeless 0 0 0  PHQ - 2 Score 0 0 0  Some encounter information is confidential and restricted. Go to Review Flowsheets activity to see all data.       Assessment & Plan:    Patient Active Problem List   Diagnosis Date Noted   Substance induced mood disorder ([place]) 11/03/2020   Generalized anxiety  disorder 11/03/2020   PTSD (post-traumatic stress disorder) 11/03/2020   Nasal congestion 02/28/2020   Gastroesophageal reflux 02/28/2020   Human immunodeficiency virus (HIV) disease ([place]) 03/20/2019   Healthcare maintenance 03/20/2019   Mild intermittent asthma without complication 25/63/8937   Essential hypertension 03/20/2019   Anxiety 03/20/2019     Problem List Items Addressed This Visit       Other   Human immunodeficiency virus (HIV) disease ([place]) - Primary    Mr. Bachus continues to have well-controlled virus with good adherence and tolerance to his ART regimen of Biktarvy.  No signs/symptoms of opportunistic infection.  We reviewed lab work and discussed plan of care.  Continue current dose of Biktarvy.  Plan for follow-up in 4 months or sooner if needed with lab work  1 to 2 weeks prior to appointment.       Relevant Medications   BIKTARVY 50-200-25 MG TABS tablet   Healthcare maintenance    Discussed importance of safe sexual practice to reduce risk of STI.  Condoms declined. Due for routine dental care with information provided in after visit summary to make appointment. Declines vaccines. Discussed/recommended COVID vaccination/booster         I have discontinued Aarnav Steagall Jr.'s amoxicillin-clavulanate, lidocaine, and ibuprofen. I am also having him maintain his albuterol, cetirizine, and Biktarvy.   Meds ordered this encounter  Medications   BIKTARVY 50-200-25 MG TABS tablet    Sig: Take 1 tablet by mouth daily.    Dispense:  30 tablet    Refill:  5    Order Specific Question:   Supervising Provider    Answer:   Carlyle Basques [[id number]]     Follow-up: Return in about 4 months (around 06/20/2021), or if symptoms worsen or fail to improve.   Terri Piedra, MSN, FNP-C Nurse Practitioner [facility] [place] number: [phone]

## 2021-03-30 ENCOUNTER — Other Ambulatory Visit: Payer: Self-pay | Admitting: Family

## 2021-03-30 DIAGNOSIS — I10 Essential (primary) hypertension: Secondary | ICD-10-CM

## 2021-03-30 NOTE — Telephone Encounter (Signed)
Spoke with patient, he says he has been taking the hydrochlorothiazide. Will refill per provider.   Sandie Ano, RN

## 2021-03-30 NOTE — Telephone Encounter (Signed)
Please advise, medication was discontinued by urgent care 11/2020

## 2021-04-07 ENCOUNTER — Other Ambulatory Visit: Payer: Self-pay | Admitting: Family

## 2021-04-07 DIAGNOSIS — B20 Human immunodeficiency virus [HIV] disease: Secondary | ICD-10-CM

## 2021-04-22 ENCOUNTER — Telehealth: Payer: Self-pay

## 2021-04-22 NOTE — Telephone Encounter (Signed)
Patient called office requesting a letter from Tammy Sours stating his new puppy is his emotional support animal for apartment complex. Patient does not have PCP. Forwarding to provider.   Epimenio Schetter Loyola Mast, RN

## 2021-04-22 NOTE — Telephone Encounter (Signed)
Provider called patient and notes that she was unable to write the letter.  Patient is no longer inpatient at [facility].  Provider recommended patient get letter from his PCP and have his animal registered.

## 2021-04-22 NOTE — Telephone Encounter (Signed)
RN left voicemail requesting call back. Provider unable to write note as he is not treating patient for depression or anxiety. Patient will need to establish care with a PCP.  Jakaria Lavergne Loyola Mast, RN

## 2021-04-22 NOTE — Telephone Encounter (Signed)
Copied from CRM [id number]. Topic: General - Call Back - No Documentation >> Apr 21, 2021 11:55 AM Randol Kern wrote: Reason for CRM: Pt called requesting to speak with [employer] regarding a letter that he needs for his apartment complex. He needs a letter stating that his need for an emotional support companion. He just recently received a puppy that has improved his mood/outlook tremendously. Please advise  Best contact: [phone] >> Apr 21, 2021  2:37 PM Jaquita Rector A wrote: Patient called back and left his email address   ( [email or web address] )

## 2021-04-23 NOTE — Telephone Encounter (Signed)
Patient made aware to establish care with PCP. Provided patient with information to [facility] at [phone]. Patient verbalized understanding.  Valarie Cones

## 2021-07-06 ENCOUNTER — Encounter: Payer: Self-pay | Admitting: Family

## 2021-07-06 ENCOUNTER — Telehealth: Payer: Self-pay

## 2021-07-06 NOTE — Telephone Encounter (Signed)
Patient called stating his medication was not refilled because his ADAP expired. Patient had financial appointment in July; Sending to financial advisor to contact patient and provide update/determine if he needs to re-apply.  Kyngston Pickelsimer Loyola Mast, RN

## 2021-07-07 ENCOUNTER — Other Ambulatory Visit: Payer: Self-pay | Admitting: Pharmacist

## 2021-07-07 DIAGNOSIS — B20 Human immunodeficiency virus [HIV] disease: Secondary | ICD-10-CM

## 2021-07-07 MED ORDER — BICTEGRAVIR-EMTRICITAB-TENOFOV 50-200-25 MG PO TABS: 1.0000 | ORAL_TABLET | Freq: Every day | ORAL | 0 refills | Status: AC

## 2021-07-07 NOTE — Progress Notes (Signed)
Medication Samples have been provided to the patient.  Drug name: Biktarvy        Strength: 50/200/25 mg       Qty: 14 tablets (2 bottles) LOT: [id number]   Exp.Date: 10/24  Dosing instructions: Take one tablet by mouth once daily  The patient has been instructed regarding the correct time, dose, and frequency of taking this medication, including desired effects and most common side effects.   Jarielys Girardot, PharmD, CPP Clinical Pharmacist Practitioner Infectious Diseases Clinical Pharmacist [facility]  

## 2021-10-02 ENCOUNTER — Other Ambulatory Visit: Payer: Self-pay

## 2021-10-02 ENCOUNTER — Encounter (HOSPITAL_COMMUNITY): Payer: Self-pay | Admitting: Emergency Medicine

## 2021-10-02 ENCOUNTER — Ambulatory Visit (INDEPENDENT_AMBULATORY_CARE_PROVIDER_SITE_OTHER): Payer: Self-pay

## 2021-10-02 ENCOUNTER — Ambulatory Visit (HOSPITAL_COMMUNITY)
Admission: EM | Admit: 2021-10-02 | Discharge: 2021-10-02 | Disposition: A | Discharge status: Home / Self Care | Payer: Self-pay | Attending: Physician Assistant | Admitting: Physician Assistant

## 2021-10-02 DIAGNOSIS — J029 Acute pharyngitis, unspecified: Secondary | ICD-10-CM

## 2021-10-02 DIAGNOSIS — U071 COVID-19: Secondary | ICD-10-CM

## 2021-10-02 DIAGNOSIS — F17210 Nicotine dependence, cigarettes, uncomplicated: Secondary | ICD-10-CM | POA: Insufficient documentation

## 2021-10-02 DIAGNOSIS — R051 Acute cough: Secondary | ICD-10-CM | POA: Insufficient documentation

## 2021-10-02 DIAGNOSIS — J40 Bronchitis, not specified as acute or chronic: Secondary | ICD-10-CM | POA: Insufficient documentation

## 2021-10-02 DIAGNOSIS — J329 Chronic sinusitis, unspecified: Secondary | ICD-10-CM | POA: Insufficient documentation

## 2021-10-02 DIAGNOSIS — Z20822 Contact with and (suspected) exposure to covid-19: Secondary | ICD-10-CM | POA: Insufficient documentation

## 2021-10-02 DIAGNOSIS — Z8616 Personal history of COVID-19: Secondary | ICD-10-CM | POA: Insufficient documentation

## 2021-10-02 DIAGNOSIS — B20 Human immunodeficiency virus [HIV] disease: Secondary | ICD-10-CM

## 2021-10-02 DIAGNOSIS — Z21 Asymptomatic human immunodeficiency virus [HIV] infection status: Secondary | ICD-10-CM | POA: Insufficient documentation

## 2021-10-02 MED ORDER — DOXYCYCLINE HYCLATE 100 MG PO CAPS: 100.0000 mg | ORAL_CAPSULE | Freq: Two times a day (BID) | ORAL | 0 refills | Status: DC

## 2021-10-02 NOTE — ED Provider Notes (Signed)
[facility]    CSN: [id number] Arrival date & time: 10/02/21  2952      History   Chief Complaint Chief Complaint  Patient presents with   Covid Positive    HPI Allen [name]. is a 36 y.o. male.   Patient presents today with a several week history of URI symptoms.  Reports nasal congestion, sore throat, hoarseness, cough, shortness of breath, night sweats.  He denies any nausea, vomiting, body aches.  He denies any known sick contacts but is around many people.  He had to call out of work today and is requesting COVID-19 testing per work protocol.  He has been using multiple over-the-counter medications including BC powders, DayQuil, NyQuil without improvement.  He reports a history of allergies and asthma but states current symptoms are not similar to previous episodes of this condition.  He does have a history of HIV and has been taking Biktarvy as prescribed.  His last HIV quant was slightly elevated in June 2022 but CD4 count was normal.  He denies any recent antibiotics.  He does smoke.  He is having difficulty with daily activities as a result of symptoms.   Past Medical History:  Diagnosis Date   Anxiety    COVID    Fracture of metacarpal base of right hand, closed 11/2015   right small finger   GERD (gastroesophageal reflux disease)    no current med.   HIV (human immunodeficiency virus infection) (HCC)    Hypertension     Patient Active Problem List   Diagnosis Date Noted   Substance induced mood disorder (HCC) 11/03/2020   Generalized anxiety disorder 11/03/2020   PTSD (post-traumatic stress disorder) 11/03/2020   Nasal congestion 02/28/2020   Gastroesophageal reflux 02/28/2020   Human immunodeficiency virus (HIV) disease (HCC) 03/20/2019   Healthcare maintenance 03/20/2019   Mild intermittent asthma without complication 03/20/2019   Essential hypertension 03/20/2019   Anxiety 03/20/2019    Past Surgical History:  Procedure Laterality Date    NO PAST SURGERIES     OPEN REDUCTION INTERNAL FIXATION (ORIF) METACARPAL Right 12/09/2015   Procedure: OPEN REDUCTION INTERNAL FIXATION (ORIF) RIGHT SMALL FINGER/METACARPAL;  Surgeon: Betha Loa, MD;  Location: [facility];  Service: Orthopedics;  Laterality: Right;       Home Medications    Prior to Admission medications   Medication Sig Start Date End Date Taking? Authorizing Provider  doxycycline (VIBRAMYCIN) 100 MG capsule Take 1 capsule (100 mg total) by mouth 2 (two) times daily. 10/02/21  Yes Humza Tallerico K, PA-C  albuterol (VENTOLIN HFA) 108 (90 Base) MCG/ACT inhaler Inhale 2 puffs into the lungs every 6 (six) hours as needed for wheezing or shortness of breath. 12/21/19   Wallis Bamberg, PA-C  BIKTARVY 50-200-25 MG TABS tablet Take 1 tablet by mouth daily. 02/17/21   Veryl Speak, FNP  cetirizine (ZYRTEC ALLERGY) 10 MG tablet Take 1 tablet (10 mg total) by mouth daily. 12/21/19   Wallis Bamberg, PA-C  hydrochlorothiazide (MICROZIDE) 12.5 MG capsule TAKE 1 CAPSULE(12.5 MG) BY MOUTH DAILY 03/30/21   Veryl Speak, FNP  famotidine (PEPCID) 20 MG tablet Take 1 tablet (20 mg total) by mouth 2 (two) times daily. Patient taking differently: Take 20 mg by mouth daily. 02/28/20 11/27/20  Veryl Speak, FNP    Family History Family History  Problem Relation Age of Onset   Cancer Mother     Social History Social History   Tobacco Use   Smoking status:  Every Day    Packs/day: 0.50    Years: 10.00    Pack years: 5.00    Types: Cigarettes   Smokeless tobacco: Never  Vaping Use   Vaping Use: Never used  Substance Use Topics   Alcohol use: Yes    Alcohol/week: 1.0 standard drink    Types: 1 Cans of beer per week    Comment: socially   Drug use: Yes    Types: Marijuana, Cocaine    Comment: marijuana daily      Allergies   Patient has no known allergies.   Review of Systems Review of Systems  Constitutional:  Positive for activity change, chills and  fatigue. Negative for appetite change and fever.  HENT:  Positive for congestion, postnasal drip, sore throat and voice change. Negative for sinus pressure, sneezing and trouble swallowing.   Respiratory:  Positive for cough and shortness of breath.   Cardiovascular:  Negative for chest pain.  Gastrointestinal:  Negative for abdominal pain, diarrhea, nausea and vomiting.  Neurological:  Positive for headaches. Negative for dizziness and light-headedness.    Physical Exam Triage Vital Signs ED Triage Vitals  Enc Vitals Group     BP 10/02/21 1935 113/77     Pulse Rate 10/02/21 1935 (!) 111     Resp 10/02/21 1935 18     Temp 10/02/21 1935 98.1 F (36.7 C)     Temp Source 10/02/21 1935 Oral     SpO2 10/02/21 1935 96 %     Weight --      Height --      Head Circumference --      Peak Flow --      Pain Score 10/02/21 1934 10     Pain Loc --      Pain Edu? --      Excl. in GC? --    No data found.  Updated Vital Signs BP 113/77 (BP Location: Right Arm)    Pulse (!) 111    Temp 98.1 F (36.7 C) (Oral)    Resp 18    SpO2 96%   Visual Acuity Right Eye Distance:   Left Eye Distance:   Bilateral Distance:    Right Eye Near:   Left Eye Near:    Bilateral Near:     Physical Exam Vitals reviewed.  Constitutional:      General: He is awake.     Appearance: Normal appearance. He is well-developed. He is not ill-appearing.     Comments: Very pleasant male appears in age no acute distress sitting comfortably in exam room  HENT:     Head: Normocephalic and atraumatic.     Right Ear: Tympanic membrane, ear canal and external ear normal. Tympanic membrane is not erythematous or bulging.     Left Ear: Tympanic membrane, ear canal and external ear normal. Tympanic membrane is not erythematous or bulging.     Nose: Nose normal.     Mouth/Throat:     Pharynx: Uvula midline. No oropharyngeal exudate or posterior oropharyngeal erythema.  Cardiovascular:     Rate and Rhythm: Normal rate  and regular rhythm.     Heart sounds: Normal heart sounds, S1 normal and S2 normal. No murmur heard. Pulmonary:     Effort: Pulmonary effort is normal. No accessory muscle usage or respiratory distress.     Breath sounds: Normal breath sounds. No stridor. No wheezing, rhonchi or rales.  Neurological:     Mental Status: He is alert.  Psychiatric:  Behavior: Behavior is cooperative.     UC Treatments / Results  Labs (all labs ordered are listed, but only abnormal results are displayed) Labs Reviewed  SARS CORONAVIRUS 2 (TAT 6-24 HRS)    EKG   Radiology DG Chest 2 View  Result Date: 10/02/2021 CLINICAL DATA:  COVID positive home test with sore throat and congestion. EXAM: CHEST - 2 VIEW COMPARISON:  Dec 19, 2020 FINDINGS: The heart size and mediastinal contours are within normal limits. Both lungs are clear. The visualized skeletal structures are unremarkable. IMPRESSION: No active cardiopulmonary disease. Electronically Signed   By: Aram Candela M.D.   On: 10/02/2021 20:09    Procedures Procedures (including critical care time)  Medications Ordered in UC Medications - No data to display  Initial Impression / Assessment and Plan / UC Course  I have reviewed the triage vital signs and the nursing notes.  Pertinent labs & imaging results that were available during my care of the patient were reviewed by me and considered in my medical decision making (see chart for details).     Concern for secondary bacterial infection given patient has been symptomatic for several weeks.  He did request a COVID-19 for his employer and this was done but discussed that he is outside the window of effectiveness for antivirals.  Chest x-ray was obtained that showed no acute cardiopulmonary disease.  He was encouraged to use over-the-counter medication for symptom management.  He was started on doxycycline 100 mg twice daily for 10 days.  He is to rest and drink plenty of fluid.  Discussed  that if symptoms or not improving within a few days he should return here or see his PCP.  If anything worsens he is to go to the emergency room including if he develops high fever, chest pain, shortness of breath, nausea/vomiting, weakness, lethargy.  Strict return precautions given to patient expressed understanding.  Work excuse note provided.  Final Clinical Impressions(s) / UC Diagnoses   Final diagnoses:  Sinobronchitis  Acute cough  HIV disease (HCC)     Discharge Instructions      We will contact you if your COVID test is positive.  Please monitor your MyChart for these results.  Your x-ray was normal.  I am concerned for the beginning of a bacterial infection given your symptoms have been present for several weeks.  Start doxycycline 100 mg twice daily.  Use Mucinex, Flonase, Tylenol for additional symptom relief.  Make sure you rest and drink plenty of fluid.  If your symptoms worsen anyway you develop high fever, worsening cough, chest pain, shortness of breath, nausea/vomiting you need to go to the emergency room.  If symptoms have not improved by early next week follow-up here or see your PCP.     ED Prescriptions     Medication Sig Dispense Auth. Provider   doxycycline (VIBRAMYCIN) 100 MG capsule Take 1 capsule (100 mg total) by mouth 2 (two) times daily. 20 capsule Caress Reffitt, Noberto Retort, PA-C      PDMP not reviewed this encounter.   Jeani Hawking, PA-C 10/02/21 2041

## 2021-10-02 NOTE — ED Triage Notes (Signed)
Pt reports been having congestion, sore throat, hoarse voice, congestion, sores on mouth. Took Covid test at home today and was positive. Told his work and they are needing proof.   Taking BC powders for pains.

## 2021-10-02 NOTE — Discharge Instructions (Signed)
We will contact you if your COVID test is positive.  Please monitor your MyChart for these results.  Your x-ray was normal.  I am concerned for the beginning of a bacterial infection given your symptoms have been present for several weeks.  Start doxycycline 100 mg twice daily.  Use Mucinex, Flonase, Tylenol for additional symptom relief.  Make sure you rest and drink plenty of fluid.  If your symptoms worsen anyway you develop high fever, worsening cough, chest pain, shortness of breath, nausea/vomiting you need to go to the emergency room.  If symptoms have not improved by early next week follow-up here or see your PCP.

## 2021-10-03 LAB — SARS CORONAVIRUS 2 (TAT 6-24 HRS): SARS Coronavirus 2: NEGATIVE

## 2021-10-16 ENCOUNTER — Other Ambulatory Visit: Payer: Self-pay

## 2021-10-16 ENCOUNTER — Ambulatory Visit: Payer: Self-pay

## 2021-10-16 DIAGNOSIS — B20 Human immunodeficiency virus [HIV] disease: Secondary | ICD-10-CM

## 2021-10-16 DIAGNOSIS — Z113 Encounter for screening for infections with a predominantly sexual mode of transmission: Secondary | ICD-10-CM

## 2021-10-16 DIAGNOSIS — Z79899 Other long term (current) drug therapy: Secondary | ICD-10-CM

## 2021-10-20 ENCOUNTER — Ambulatory Visit: Payer: Self-pay

## 2021-10-20 ENCOUNTER — Other Ambulatory Visit: Payer: Self-pay

## 2021-10-21 ENCOUNTER — Ambulatory Visit: Payer: Self-pay

## 2021-10-22 ENCOUNTER — Other Ambulatory Visit: Payer: Self-pay

## 2021-10-22 ENCOUNTER — Ambulatory Visit: Payer: Self-pay

## 2021-10-22 ENCOUNTER — Encounter: Payer: Self-pay | Admitting: Family

## 2021-10-22 DIAGNOSIS — B20 Human immunodeficiency virus [HIV] disease: Secondary | ICD-10-CM

## 2021-10-22 DIAGNOSIS — Z113 Encounter for screening for infections with a predominantly sexual mode of transmission: Secondary | ICD-10-CM

## 2021-10-22 MED ORDER — BIKTARVY 50-200-25 MG PO TABS: 1.0000 | ORAL_TABLET | Freq: Every day | ORAL | 0 refills | Status: DC

## 2021-10-25 LAB — CBC WITH DIFFERENTIAL/PLATELET
Absolute Monocytes: 431 cells/uL (ref 200–950)
Basophils Absolute: 29 cells/uL (ref 0–200)
Basophils Relative: 0.6 %
Eosinophils Absolute: 152 cells/uL (ref 15–500)
Eosinophils Relative: 3.1 %
HCT: 40.5 % (ref 38.5–50.0)
Hemoglobin: 13.5 g/dL (ref 13.2–17.1)
Lymphs Abs: 1642 cells/uL (ref 850–3900)
MCH: 27.4 pg (ref 27.0–33.0)
MCHC: 33.3 g/dL (ref 32.0–36.0)
MCV: 82.2 fL (ref 80.0–100.0)
MPV: 10.3 fL (ref 7.5–12.5)
Monocytes Relative: 8.8 %
Neutro Abs: 2646 cells/uL (ref 1500–7800)
Neutrophils Relative %: 54 %
Platelets: 321 10*3/uL (ref 140–400)
RBC: 4.93 10*6/uL (ref 4.20–5.80)
RDW: 13.6 % (ref 11.0–15.0)
Total Lymphocyte: 33.5 %
WBC: 4.9 10*3/uL (ref 3.8–10.8)

## 2021-10-25 LAB — LIPID PANEL
Cholesterol: 171 mg/dL (ref <200)
HDL: 50 mg/dL (ref >=40)
LDL Cholesterol (Calc): 97 mg/dL (calc)
Non-HDL Cholesterol (Calc): 121 mg/dL (calc) (ref <130)
Total CHOL/HDL Ratio: 3.4 (calc) (ref <5.0)
Triglycerides: 143 mg/dL (ref <150)

## 2021-10-25 LAB — COMPLETE METABOLIC PANEL WITH GFR
AG Ratio: 1.8 (calc) (ref 1.0–2.5)
ALT: 30 U/L (ref 9–46)
AST: 29 U/L (ref 10–40)
Albumin: 4.6 g/dL (ref 3.6–5.1)
Alkaline phosphatase (APISO): 76 U/L (ref 36–130)
BUN: 13 mg/dL (ref 7–25)
CO2: 23 mmol/L (ref 20–32)
Calcium: 9.7 mg/dL (ref 8.6–10.3)
Chloride: 104 mmol/L (ref 98–110)
Creat: 0.95 mg/dL (ref 0.60–1.26)
Globulin: 2.5 g/dL (calc) (ref 1.9–3.7)
Glucose, Bld: 126 mg/dL — ABNORMAL HIGH (ref 65–99)
Potassium: 4.4 mmol/L (ref 3.5–5.3)
Sodium: 139 mmol/L (ref 135–146)
Total Bilirubin: 0.3 mg/dL (ref 0.2–1.2)
Total Protein: 7.1 g/dL (ref 6.1–8.1)
eGFR: 107 mL/min/{1.73_m2} (ref >=60)

## 2021-10-25 LAB — HIV-1 RNA QUANT-NO REFLEX-BLD
HIV 1 RNA Quant: <20 Copies/mL — ABNORMAL HIGH
HIV-1 RNA Quant, Log: <1.3 Log cps/mL — ABNORMAL HIGH

## 2021-10-25 LAB — RPR: RPR Ser Ql: NONREACTIVE

## 2021-10-25 LAB — T-HELPER CELLS (CD4) COUNT (NOT AT ARMC)
Absolute CD4: 912 cells/uL (ref 490–1740)
CD4 T Helper %: 50 % (ref 30–61)
Total lymphocyte count: 1815 cells/uL (ref 850–3900)

## 2021-10-29 ENCOUNTER — Other Ambulatory Visit: Payer: Self-pay | Admitting: Pharmacist

## 2021-10-29 DIAGNOSIS — B20 Human immunodeficiency virus [HIV] disease: Secondary | ICD-10-CM

## 2021-10-29 MED ORDER — BICTEGRAVIR-EMTRICITAB-TENOFOV 50-200-25 MG PO TABS: 1.0000 | ORAL_TABLET | Freq: Every day | ORAL | 0 refills | Status: DC

## 2021-10-29 NOTE — Progress Notes (Signed)
Medication Samples have been provided to the patient. ? ?Drug name: Biktarvy        ?Strength: 50/200/25 mg       ?Qty: 28 tablets (4 bottles) ?LOT: [id number]   ?Exp.Date: 10/24 ? ?Dosing instructions: Take one tablet by mouth once daily ? ?The patient has been instructed regarding the correct time, dose, and frequency of taking this medication, including desired effects and most common side effects.  ? ?Bryann Gentz, PharmD, CPP ?Clinical Pharmacist Practitioner ?Infectious Diseases Clinical Pharmacist ?[facility] ? ?

## 2021-10-30 ENCOUNTER — Other Ambulatory Visit: Payer: Self-pay

## 2021-10-30 ENCOUNTER — Ambulatory Visit (INDEPENDENT_AMBULATORY_CARE_PROVIDER_SITE_OTHER): Payer: Self-pay | Admitting: Family

## 2021-10-30 ENCOUNTER — Encounter: Payer: Self-pay | Admitting: Family

## 2021-10-30 VITALS — BP 130/93 | Temp 97.9°F | Wt 233.8 lb

## 2021-10-30 DIAGNOSIS — Z0000 Encounter for general adult medical examination without abnormal findings: Secondary | ICD-10-CM

## 2021-10-30 DIAGNOSIS — B20 Human immunodeficiency virus [HIV] disease: Secondary | ICD-10-CM

## 2021-10-30 DIAGNOSIS — Z113 Encounter for screening for infections with a predominantly sexual mode of transmission: Secondary | ICD-10-CM

## 2021-10-30 DIAGNOSIS — I10 Essential (primary) hypertension: Secondary | ICD-10-CM

## 2021-10-30 DIAGNOSIS — R0981 Nasal congestion: Secondary | ICD-10-CM

## 2021-10-30 MED ORDER — BIKTARVY 50-200-25 MG PO TABS: 1.0000 | ORAL_TABLET | Freq: Every day | ORAL | 5 refills | Status: DC

## 2021-10-30 MED ORDER — PREDNISONE 20 MG PO TABS: 40.0000 mg | ORAL_TABLET | Freq: Every day | ORAL | 0 refills | Status: DC

## 2021-10-30 MED ORDER — HYDROCHLOROTHIAZIDE 12.5 MG PO CAPS: 12.5000 mg | ORAL_CAPSULE | Freq: Every day | ORAL | 5 refills | Status: DC

## 2021-10-30 NOTE — Assessment & Plan Note (Signed)
Allen [name] continues to have well-controlled virus with good adherence and tolerance to his ART regimen of Biktarvy.  No signs/symptoms of opportunistic infection.  We reviewed lab work and discussed plan of care.  Continue current dose of Biktarvy.  Plan for follow-up in 4 months or sooner if needed with lab work 1 to 2 weeks prior to appointment. 

## 2021-10-30 NOTE — Assessment & Plan Note (Signed)
Allen [name] has nasal congestion that has been refractory to over-the-counter antihistamines going on for approximately 1 month.  No evidence of bacterial infection.  Start prednisone.  Continue over-the-counter antihistamines as needed.  Follow-up if symptoms worsen or do not improve. ?

## 2021-10-30 NOTE — Patient Instructions (Addendum)
Nice to see you.  Continue to take your medication daily as prescribed.  Refills have been sent to the pharmacy.  Plan for follow up in 4 months or sooner if needed with lab work 1-2 weeks prior to appointment.   Have a great day and stay safe!  

## 2021-10-30 NOTE — Progress Notes (Signed)
? ? ?Brief Narrative  ? ?Patient ID: Allen Bowker., male    DOB: 04-26-1986, 36 y.o.   MRN: [id number] ? ?Mr. Allen [name] is a 36 y/o AA male diagnosed with HIV in 2018 with risk factor of tattoo or heterosexual contact. Initial CD4 and viral load are unavailable. [facility] initial lab work with undetectable viral load and CD4 count of 838. HLAB 5701 negative. No history of opportunistic infection. Previous ART regimen of Prezcobix and Descovy and switched to [facility] after experiencing gastrointestinal issues.  ? ?Subjective:  ?  ?Chief Complaint  ?Patient presents with  ? Follow-up  ?  Congestion over a month.   ? ? ?HPI: ? ?Allen [name]. is a 36 y.o. male with HIV disease last seen on 02/17/2021 with well-controlled virus and good adherence and tolerance to his ART regimen of Biktarvy.  Viral load was undetectable with CD4 count of 767.  Most recent blood work completed on 10/22/2021 with viral load that remains undetectable and CD4 count of 912.  RPR nonreactive for syphilis and kidney function, liver function, electrolytes within normal ranges.  Here today for routine follow-up. ? ?Allen [name] continues to take his Biktarvy daily as prescribed with no adverse side effects.  Overall feeling well today although has had nasal congestion going on for approximately 1 month that has been refractory to over-the-counter medications.  Denies any systemic symptoms. Denies fevers, chills, night sweats, headaches, changes in vision, neck pain/stiffness, nausea, diarrhea, vomiting, lesions or rashes. ? ?Allen [name] has no problems obtaining medication from the pharmacy.  Denies feelings of being down, depressed, or hopeless recently.  Continues to smoke tobacco and marijuana daily with occasional/social alcohol consumption.  Condoms offered.  Healthcare maintenance due include pneumococcal and meningococcal vaccinations.  Recently changed jobs secondary to stress.  Now a much better situation ? ?No Known  Allergies ? ? ? ?Outpatient Medications Prior to Visit  ?Medication Sig Dispense Refill  ? albuterol (VENTOLIN HFA) 108 (90 Base) MCG/ACT inhaler Inhale 2 puffs into the lungs every 6 (six) hours as needed for wheezing or shortness of breath. 18 g 0  ? cetirizine (ZYRTEC ALLERGY) 10 MG tablet Take 1 tablet (10 mg total) by mouth daily. 30 tablet 0  ? bictegravir-emtricitabine-tenofovir AF (BIKTARVY) 50-200-25 MG TABS tablet Take 1 tablet by mouth daily for 28 days. 28 tablet 0  ? BIKTARVY 50-200-25 MG TABS tablet Take 1 tablet by mouth daily. 30 tablet 0  ? doxycycline (VIBRAMYCIN) 100 MG capsule Take 1 capsule (100 mg total) by mouth 2 (two) times daily. 20 capsule 0  ? hydrochlorothiazide (MICROZIDE) 12.5 MG capsule TAKE 1 CAPSULE(12.5 MG) BY MOUTH DAILY 30 capsule 4  ? ?No facility-administered medications prior to visit.  ? ? ? ?Past Medical History:  ?Diagnosis Date  ? Anxiety   ? COVID   ? Fracture of metacarpal base of right hand, closed 11/2015  ? right small finger  ? GERD (gastroesophageal reflux disease)   ? no current med.  ? HIV (human immunodeficiency virus infection) ([place])   ? Hypertension   ? ? ? ?Past Surgical History:  ?Procedure Laterality Date  ? NO PAST SURGERIES    ? OPEN REDUCTION INTERNAL FIXATION (ORIF) METACARPAL Right 12/09/2015  ? Procedure: OPEN REDUCTION INTERNAL FIXATION (ORIF) RIGHT SMALL FINGER/METACARPAL;  Surgeon: Leanora Cover, MD;  Location: [place];  Service: Orthopedics;  Laterality: Right;  ? ? ? ? ?Review of Systems  ?Constitutional:  Negative for appetite change, chills, fatigue, fever and  unexpected weight change.  ?HENT:  Positive for congestion.   ?Eyes:  Negative for visual disturbance.  ?Respiratory:  Negative for cough, chest tightness, shortness of breath and wheezing.   ?Cardiovascular:  Negative for chest pain and leg swelling.  ?Gastrointestinal:  Negative for abdominal pain, constipation, diarrhea, nausea and vomiting.  ?Genitourinary:  Negative for  dysuria, flank pain, frequency, genital sores, hematuria and urgency.  ?Skin:  Negative for rash.  ?Allergic/Immunologic: Negative for immunocompromised state.  ?Neurological:  Negative for dizziness and headaches.  ?   ?Objective:  ?  ?BP (!) 130/93   Temp 97.9 ?F (36.6 ?C) (Oral)   Wt 233 lb 12.8 oz (106.1 kg)   SpO2 98%   BMI 35.55 kg/m?  ?Nursing note and vital signs reviewed. ? ?Physical Exam ?Constitutional:   ?   General: He is not in acute distress. ?   Appearance: He is well-developed.  ?Eyes:  ?   Conjunctiva/sclera: Conjunctivae normal.  ?Cardiovascular:  ?   Rate and Rhythm: Normal rate and regular rhythm.  ?   Heart sounds: Normal heart sounds. No murmur heard. ?  No friction rub. No gallop.  ?Pulmonary:  ?   Effort: Pulmonary effort is normal. No respiratory distress.  ?   Breath sounds: Normal breath sounds. No wheezing or rales.  ?Chest:  ?   Chest wall: No tenderness.  ?Abdominal:  ?   General: Bowel sounds are normal.  ?   Palpations: Abdomen is soft.  ?   Tenderness: There is no abdominal tenderness.  ?Musculoskeletal:  ?   Cervical back: Neck supple.  ?Lymphadenopathy:  ?   Cervical: No cervical adenopathy.  ?Skin: ?   General: Skin is warm and dry.  ?   Findings: No rash.  ?Neurological:  ?   Mental Status: He is alert and oriented to person, place, and time.  ?Psychiatric:     ?   Behavior: Behavior normal.     ?   Thought Content: Thought content normal.     ?   Judgment: Judgment normal.  ? ? ? ? ?  11/03/2020  ?  9:17 AM 10/06/2020  ?  1:42 PM 02/28/2020  ? 10:03 AM 03/20/2019  ?  8:57 AM  ?Depression screen PHQ 2/9  ?Decreased Interest  0 0 0  ?Down, Depressed, Hopeless  0 0 0  ?PHQ - 2 Score  0 0 0  ?Altered sleeping      ?Tired, decreased energy      ?Change in appetite      ?Feeling bad or failure about yourself       ?Trouble concentrating      ?Moving slowly or fidgety/restless      ?Suicidal thoughts      ?PHQ-9 Score      ?Difficult doing work/chores      ?  ? Information is  confidential and restricted. Go to Review Flowsheets to unlock data.  ?  ?   ?Assessment & Plan:  ? ? ?Patient Active Problem List  ? Diagnosis Date Noted  ? Substance induced mood disorder ([place]) 11/03/2020  ? Generalized anxiety disorder 11/03/2020  ? PTSD (post-traumatic stress disorder) 11/03/2020  ? Nasal congestion 02/28/2020  ? Gastroesophageal reflux 02/28/2020  ? Human immunodeficiency virus (HIV) disease ([place]) 03/20/2019  ? Healthcare maintenance 03/20/2019  ? Mild intermittent asthma without complication 15/72/6203  ? Essential hypertension 03/20/2019  ? Anxiety 03/20/2019  ? ? ? ?Problem List Items Addressed This Visit   ? ?  ?  Cardiovascular and Mediastinum  ? Essential hypertension  ?  Blood pressure mildly elevated above goal of 140/90 with current dose of hydrochlorothiazide.  No headaches or changes in vision.  Encouraged to monitor blood pressure at home as able.  Continue current dose of hydrochlorothiazide. ?  ?  ? Relevant Medications  ? hydrochlorothiazide (MICROZIDE) 12.5 MG capsule  ?  ? Other  ? Human immunodeficiency virus (HIV) disease ([place]) - Primary  ?  Allen [name] continues to have well-controlled virus with good adherence and tolerance to his ART regimen of Biktarvy.  No signs/symptoms of opportunistic infection.  We reviewed lab work and discussed plan of care.  Continue current dose of Biktarvy.  Plan for follow-up in 4 months or sooner if needed with lab work 1 to 2 weeks prior to appointment. ?  ?  ? Relevant Medications  ? BIKTARVY 50-200-25 MG TABS tablet  ? Healthcare maintenance  ?  Discussed importance of safe sexual practices and condom use.  Condoms offered. ?Recommended pneumococcal and meningococcal vaccinations which were declined. ?Routine dental care up to date.  ?  ?  ? Nasal congestion  ?  Allen [name] has nasal congestion that has been refractory to over-the-counter antihistamines going on for approximately 1 month.  No evidence of bacterial infection.  Start  prednisone.  Continue over-the-counter antihistamines as needed.  Follow-up if symptoms worsen or do not improve. ?  ?  ? ? ? ?I have discontinued Allen Kreuzer Jr.'s doxycycline and bictegravir-emtricitabine-tenofovir AF. I have also

## 2021-10-30 NOTE — Assessment & Plan Note (Signed)
?   Discussed importance of safe sexual practices and condom use.  Condoms offered. ?? Recommended pneumococcal and meningococcal vaccinations which were declined. ?? Routine dental care up to date.  ?

## 2021-10-30 NOTE — Assessment & Plan Note (Signed)
Blood pressure mildly elevated above goal of 140/90 with current dose of hydrochlorothiazide.  No headaches or changes in vision.  Encouraged to monitor blood pressure at home as able.  Continue current dose of hydrochlorothiazide. ?

## 2021-12-30 ENCOUNTER — Ambulatory Visit (HOSPITAL_COMMUNITY): Payer: Self-pay

## 2022-01-26 ENCOUNTER — Ambulatory Visit (HOSPITAL_COMMUNITY): Payer: Self-pay

## 2022-02-22 ENCOUNTER — Ambulatory Visit (INDEPENDENT_AMBULATORY_CARE_PROVIDER_SITE_OTHER): Payer: Self-pay | Admitting: Family

## 2022-02-22 ENCOUNTER — Encounter: Payer: Self-pay | Admitting: Family

## 2022-02-22 ENCOUNTER — Other Ambulatory Visit: Payer: Self-pay

## 2022-02-22 ENCOUNTER — Ambulatory Visit: Payer: Self-pay

## 2022-02-22 VITALS — BP 130/93 | HR 100 | Temp 97.6°F | Ht 68.0 in | Wt 226.0 lb

## 2022-02-22 DIAGNOSIS — Z0000 Encounter for general adult medical examination without abnormal findings: Secondary | ICD-10-CM

## 2022-02-22 DIAGNOSIS — Z113 Encounter for screening for infections with a predominantly sexual mode of transmission: Secondary | ICD-10-CM

## 2022-02-22 DIAGNOSIS — J4520 Mild intermittent asthma, uncomplicated: Secondary | ICD-10-CM

## 2022-02-22 DIAGNOSIS — I10 Essential (primary) hypertension: Secondary | ICD-10-CM

## 2022-02-22 DIAGNOSIS — B20 Human immunodeficiency virus [HIV] disease: Secondary | ICD-10-CM

## 2022-02-22 MED ORDER — ALBUTEROL SULFATE HFA 108 (90 BASE) MCG/ACT IN AERS: 2.0000 | INHALATION_SPRAY | Freq: Four times a day (QID) | RESPIRATORY_TRACT | 5 refills | Status: AC | PRN

## 2022-02-22 MED ORDER — HYDROCHLOROTHIAZIDE 12.5 MG PO CAPS: 12.5000 mg | ORAL_CAPSULE | Freq: Every day | ORAL | 5 refills | Status: DC

## 2022-02-22 MED ORDER — BIKTARVY 50-200-25 MG PO TABS: 1.0000 | ORAL_TABLET | Freq: Every day | ORAL | 5 refills | Status: DC

## 2022-02-22 NOTE — Assessment & Plan Note (Signed)
Allen [name] uses his inhaler on occasion with no significant exacerbations since last office visit. Appears adequately controlled with current dose of albuterol as needed.

## 2022-02-22 NOTE — Assessment & Plan Note (Signed)
Allen [name] continues to have well-controlled virus with good adherence and tolerance to [employer].  Reviewed lab work and discussed plan of care.  Has not experienced significant weight gain over the past 12 months.  Wt Readings from Last 3 Encounters:  02/22/22 226 lb (102.5 kg)  10/30/21 233 lb 12.8 oz (106.1 kg)  02/17/21 229 lb (103.9 kg)   Renewed financial assistance.  Following discussion of plan of care we will continue current dose of Biktarvy.  Check blood work today.  Plan for follow-up in months or sooner if needed with lab work on the same day.

## 2022-02-22 NOTE — Assessment & Plan Note (Signed)
Blood pressure mildly elevated with diastolic pressure of 93.  Likely transient and has no signs/symptoms concerning for neurological or ophthalmologic pathology.  Encouraged to monitor blood pressure at home.  Continue current dose of hydrochlorothiazide.  Consider increasing medication if blood pressure continues to remain elevated.

## 2022-02-22 NOTE — Assessment & Plan Note (Signed)
   Discussed importance of safe sexual practices and condom use.  Condoms offered.  Declines vaccinations.  Routine dental care which he will complete independently and can refer to [facility] if needed.

## 2022-02-22 NOTE — Progress Notes (Signed)
Brief Narrative   Patient ID: Allen Wingerter., male    DOB: June 07, 1986, 36 y.o.   MRN: [id number]  Allen [name] is a 36 y/o AA male diagnosed with HIV in 2018 with risk factor of tattoo or heterosexual contact. Initial CD4 and viral load are unavailable. [facility] initial lab work with undetectable viral load and CD4 count of 838. HLAB 5701 negative. No history of opportunistic infection. Previous ART regimen of Prezcobix and Descovy and switched to [facility] after experiencing gastrointestinal issues.     Subjective:    Chief Complaint  Patient presents with   Follow-up    Would like to discuss switching ART, feels like he's been gaining weight on Biktarvy    HPI:  Allen Shadwick. is a 36 y.o. male with HIV disease last seen on 10/30/2021 with well-controlled virus and good adherence and tolerance to Allen [name].  Viral load was undetectable with CD4 count of 912.  Kidney function, liver function, electrolytes within normal ranges.  Lipid profile was normal with LDL 97, HDL 50, and triglycerides 143.  Here today for routine follow-up.  Allen [name] continues to take his Biktarvy daily as prescribed with no adverse side effects.  Overall feeling well today with concern for increases in weight since starting on Biktarvy. Denies fevers, chills, night sweats, headaches, changes in vision, neck pain/stiffness, nausea, diarrhea, vomiting, lesions or rashes.  Allen [name] has no problems obtaining medication from the pharmacy and renewed financial assistance.  Denies feelings of being down, depressed, or hopeless recently.  Drinks alcohol socially and uses marijuana daily as well as tobacco daily.  Condoms offered. Declines immunizations.   Has some concern about weight gain with Biktarvy   No Known Allergies    Outpatient Medications Prior to Visit  Medication Sig Dispense Refill   cetirizine (ZYRTEC ALLERGY) 10 MG tablet Take 1 tablet (10 mg total) by mouth daily. 30 tablet 0    albuterol (VENTOLIN HFA) 108 (90 Base) MCG/ACT inhaler Inhale 2 puffs into the lungs every 6 (six) hours as needed for wheezing or shortness of breath. 18 g 0   BIKTARVY 50-200-25 MG TABS tablet Take 1 tablet by mouth daily. 30 tablet 5   hydrochlorothiazide (MICROZIDE) 12.5 MG capsule Take 1 capsule (12.5 mg total) by mouth daily. 30 capsule 5   predniSONE (DELTASONE) 20 MG tablet Take 2 tablets (40 mg total) by mouth daily with breakfast. (Patient not taking: Reported on 02/22/2022) 6 tablet 0   No facility-administered medications prior to visit.     Past Medical History:  Diagnosis Date   Anxiety    COVID    Fracture of metacarpal base of right hand, closed 11/2015   right small finger   GERD (gastroesophageal reflux disease)    no current med.   HIV (human immunodeficiency virus infection) ([place])    Hypertension      Past Surgical History:  Procedure Laterality Date   NO PAST SURGERIES     OPEN REDUCTION INTERNAL FIXATION (ORIF) METACARPAL Right 12/09/2015   Procedure: OPEN REDUCTION INTERNAL FIXATION (ORIF) RIGHT SMALL FINGER/METACARPAL;  Surgeon: Leanora Cover, MD;  Location: [place];  Service: Orthopedics;  Laterality: Right;      Review of Systems  Constitutional:  Negative for appetite change, chills, fatigue, fever and unexpected weight change.  Eyes:  Negative for visual disturbance.  Respiratory:  Negative for cough, chest tightness, shortness of breath and wheezing.   Cardiovascular:  Negative for chest pain and leg swelling.  Gastrointestinal:  Negative for abdominal pain, constipation, diarrhea, nausea and vomiting.  Genitourinary:  Negative for dysuria, flank pain, frequency, genital sores, hematuria and urgency.  Skin:  Negative for rash.  Allergic/Immunologic: Negative for immunocompromised state.  Neurological:  Negative for dizziness and headaches.      Objective:    BP (!) 130/93   Pulse 100   Temp 97.6 F (36.4 C) (Oral)   Ht $R'5\' 8"'Gs$   (1.727 m)   Wt 226 lb (102.5 kg)   SpO2 95%   BMI 34.36 kg/m  Nursing note and vital signs reviewed.  Physical Exam Constitutional:      General: He is not in acute distress.    Appearance: He is well-developed.  Eyes:     Conjunctiva/sclera: Conjunctivae normal.  Cardiovascular:     Rate and Rhythm: Normal rate and regular rhythm.     Heart sounds: Normal heart sounds. No murmur heard.    No friction rub. No gallop.  Pulmonary:     Effort: Pulmonary effort is normal. No respiratory distress.     Breath sounds: Normal breath sounds. No wheezing or rales.  Chest:     Chest wall: No tenderness.  Abdominal:     General: Bowel sounds are normal.     Palpations: Abdomen is soft.     Tenderness: There is no abdominal tenderness.  Musculoskeletal:     Cervical back: Neck supple.  Lymphadenopathy:     Cervical: No cervical adenopathy.  Skin:    General: Skin is warm and dry.     Findings: No rash.  Neurological:     Mental Status: He is alert and oriented to person, place, and time.  Psychiatric:        Behavior: Behavior normal.        Thought Content: Thought content normal.        Judgment: Judgment normal.         02/22/2022    9:05 AM 11/03/2020    9:17 AM 10/06/2020    1:42 PM 02/28/2020   10:03 AM 03/20/2019    8:57 AM  Depression screen PHQ 2/9  Decreased Interest 3  0 0 0  Down, Depressed, Hopeless 1  0 0 0  PHQ - 2 Score 4  0 0 0  Altered sleeping 3      Tired, decreased energy 3      Change in appetite 3      Feeling bad or failure about yourself  1      Trouble concentrating       Moving slowly or fidgety/restless 1      Suicidal thoughts 0      PHQ-9 Score 15      Difficult doing work/chores Very difficult         Information is confidential and restricted. Go to Review Flowsheets to unlock data.       Assessment & Plan:    Patient Active Problem List   Diagnosis Date Noted   Substance induced mood disorder ([place]) 11/03/2020   Generalized  anxiety disorder 11/03/2020   PTSD (post-traumatic stress disorder) 11/03/2020   Nasal congestion 02/28/2020   Gastroesophageal reflux 02/28/2020   Human immunodeficiency virus (HIV) disease ([place]) 03/20/2019   Healthcare maintenance 03/20/2019   Mild intermittent asthma without complication 49/67/5916   Essential hypertension 03/20/2019   Anxiety 03/20/2019     Problem List Items Addressed This Visit       Cardiovascular and Mediastinum   Essential hypertension  Blood pressure mildly elevated with diastolic pressure of 93.  Likely transient and has no signs/symptoms concerning for neurological or ophthalmologic pathology.  Encouraged to monitor blood pressure at home.  Continue current dose of hydrochlorothiazide.  Consider increasing medication if blood pressure continues to remain elevated.      Relevant Medications   hydrochlorothiazide (MICROZIDE) 12.5 MG capsule     Respiratory   Mild intermittent asthma without complication    Allen [name] uses his inhaler on occasion with no significant exacerbations since last office visit. Appears adequately controlled with current dose of albuterol as needed.       Relevant Medications   albuterol (VENTOLIN HFA) 108 (90 Base) MCG/ACT inhaler     Other   Human immunodeficiency virus (HIV) disease ([place]) - Primary    Allen [name] continues to have well-controlled virus with good adherence and tolerance to Allen [name].  Reviewed lab work and discussed plan of care.  Has not experienced significant weight gain over the past 12 months.  Wt Readings from Last 3 Encounters:  02/22/22 226 lb (102.5 kg)  10/30/21 233 lb 12.8 oz (106.1 kg)  02/17/21 229 lb (103.9 kg)   Renewed financial assistance.  Following discussion of plan of care we will continue current dose of Biktarvy.  Check blood work today.  Plan for follow-up in months or sooner if needed with lab work on the same day.      Relevant Medications   BIKTARVY 50-200-25 MG TABS  tablet   Other Relevant Orders   Comprehensive metabolic panel   HIV-1 RNA quant-no reflex-bld   T-helper cell (CD4)- ([facility] clinic only)   Healthcare maintenance    Discussed importance of safe sexual practices and condom use.  Condoms offered. Declines vaccinations. Routine dental care which he will complete independently and can refer to [facility] if needed.       Other Visit Diagnoses     Screening for STDs (sexually transmitted diseases)       Relevant Orders   RPR        I have discontinued Allen Poke Jr.'s predniSONE. I am also having him maintain his cetirizine, albuterol, Biktarvy, and hydrochlorothiazide.   Meds ordered this encounter  Medications   albuterol (VENTOLIN HFA) 108 (90 Base) MCG/ACT inhaler    Sig: Inhale 2 puffs into the lungs every 6 (six) hours as needed for wheezing or shortness of breath.    Dispense:  18 g    Refill:  5    Order Specific Question:   Supervising Provider    Answer:   SNIDER, CYNTHIA [[id number]]   BIKTARVY 50-200-25 MG TABS tablet    Sig: Take 1 tablet by mouth daily.    Dispense:  30 tablet    Refill:  5    Order Specific Question:   Supervising Provider    Answer:   Baxter Flattery, CYNTHIA [[id number]]   hydrochlorothiazide (MICROZIDE) 12.5 MG capsule    Sig: Take 1 capsule (12.5 mg total) by mouth daily.    Dispense:  30 capsule    Refill:  5    Order Specific Question:   Supervising Provider    Answer:   Carlyle Basques [[id number]]     Follow-up: Return in about 4 months (around 06/25/2022), or if symptoms worsen or fail to improve.   Terri Piedra, MSN, FNP-C Nurse Practitioner [facility] [place] number: [phone]

## 2022-02-22 NOTE — Patient Instructions (Addendum)
Nice to see you.  We will check your lab work today.  Continue to take your medication daily as prescribed.  Refills have been sent to the pharmacy.  Plan for follow up in 4 months or sooner if needed with lab work on the same day.  Have a great day and stay safe!  

## 2022-02-23 LAB — T-HELPER CELL (CD4) - (RCID CLINIC ONLY)
CD4 % Helper T Cell: 53 % (ref 33–65)
CD4 T Cell Abs: 936 /uL (ref 400–1790)

## 2022-02-24 LAB — COMPREHENSIVE METABOLIC PANEL
AG Ratio: 1.6 (calc) (ref 1.0–2.5)
ALT: 19 U/L (ref 9–46)
AST: 16 U/L (ref 10–40)
Albumin: 4.9 g/dL (ref 3.6–5.1)
Alkaline phosphatase (APISO): 78 U/L (ref 36–130)
BUN/Creatinine Ratio: 7 (calc) (ref 6–22)
BUN: 9 mg/dL (ref 7–25)
CO2: 26 mmol/L (ref 20–32)
Calcium: 10 mg/dL (ref 8.6–10.3)
Chloride: 99 mmol/L (ref 98–110)
Creat: 1.29 mg/dL — ABNORMAL HIGH (ref 0.60–1.26)
Globulin: 3 g/dL (calc) (ref 1.9–3.7)
Glucose, Bld: 101 mg/dL — ABNORMAL HIGH (ref 65–99)
Potassium: 3.9 mmol/L (ref 3.5–5.3)
Sodium: 138 mmol/L (ref 135–146)
Total Bilirubin: 0.6 mg/dL (ref 0.2–1.2)
Total Protein: 7.9 g/dL (ref 6.1–8.1)

## 2022-02-24 LAB — RPR: RPR Ser Ql: REACTIVE — AB

## 2022-02-24 LAB — HIV-1 RNA QUANT-NO REFLEX-BLD
HIV 1 RNA Quant: 26 Copies/mL — ABNORMAL HIGH
HIV-1 RNA Quant, Log: 1.42 Log cps/mL — ABNORMAL HIGH

## 2022-02-24 LAB — RPR TITER: RPR Titer: 1:2 {titer} — ABNORMAL HIGH

## 2022-02-24 LAB — FLUORESCENT TREPONEMAL AB(FTA)-IGG-BLD: Fluorescent Treponemal ABS: REACTIVE — AB

## 2022-04-02 ENCOUNTER — Encounter (HOSPITAL_COMMUNITY): Payer: Self-pay | Admitting: Emergency Medicine

## 2022-04-02 ENCOUNTER — Ambulatory Visit (HOSPITAL_COMMUNITY)
Admission: EM | Admit: 2022-04-02 | Discharge: 2022-04-02 | Disposition: A | Discharge status: Home / Self Care | Payer: Self-pay | Attending: Emergency Medicine | Admitting: Emergency Medicine

## 2022-04-02 DIAGNOSIS — J040 Acute laryngitis: Secondary | ICD-10-CM

## 2022-04-02 MED ORDER — PREDNISONE 10 MG (21) PO TBPK: ORAL_TABLET | ORAL | 0 refills | Status: DC

## 2022-04-02 NOTE — Discharge Instructions (Signed)
Take prednisone as prescribed. Rest voice as much as possible and keep hydrated. Can try cough drops or throat coat tea. Follow-up with PCP or ENT if no improvement in a week or two. Go to the ER if develop difficulty breathing or inability to swallow.

## 2022-04-02 NOTE — ED Triage Notes (Signed)
Pt reports voice is hoarse and going away for about 3 weeks. Works at [employer] and having to work on a microphone that is broke and has to yell/talk loud. Reports does have occasional cough very few times is productive.

## 2022-04-02 NOTE — ED Provider Notes (Signed)
[facility]    CSN: [id number] Arrival date & time: 04/02/22  1509      History   Chief Complaint Chief Complaint  Patient presents with   Hoarse    HPI Allen [name]. is a 36 y.o. male.   Patient presents with concerns of hoarse voice for the past 3 weeks or so. 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 He denies feeling sick or having cold symptoms but has had mild dry cough. He denies fever, nasal congestion, difficulty breathing, inability to swallow, or past similar. He reports the throat isn't really sore except sometimes after straining it a lot. He has tried OTC medicine like NyQuil with minimal improvement. The patient inquires about getting a work note so he can switch positions so he is able to rest his voice.   The history is provided by the patient.    Past Medical History:  Diagnosis Date   Anxiety    COVID    Fracture of metacarpal base of right hand, closed 11/2015   right small finger   GERD (gastroesophageal reflux disease)    no current med.   HIV (human immunodeficiency virus infection) (HCC)    Hypertension     Patient Active Problem List   Diagnosis Date Noted   Substance induced mood disorder (HCC) 11/03/2020   Generalized anxiety disorder 11/03/2020   PTSD (post-traumatic stress disorder) 11/03/2020   Nasal congestion 02/28/2020   Gastroesophageal reflux 02/28/2020   Human immunodeficiency virus (HIV) disease (HCC) 03/20/2019   Healthcare maintenance 03/20/2019   Mild intermittent asthma without complication 03/20/2019   Essential hypertension 03/20/2019   Anxiety 03/20/2019    Past Surgical History:  Procedure Laterality Date   NO PAST SURGERIES     OPEN REDUCTION INTERNAL FIXATION (ORIF) METACARPAL Right 12/09/2015   Procedure: OPEN REDUCTION INTERNAL FIXATION (ORIF) RIGHT SMALL FINGER/METACARPAL;  Surgeon: Betha Loa, MD;   Location: [facility];  Service: Orthopedics;  Laterality: Right;       Home Medications    Prior to Admission medications   Medication Sig Start Date End Date Taking? Authorizing Provider  predniSONE (STERAPRED UNI-PAK 21 TAB) 10 MG (21) TBPK tablet Take as directed 04/02/22  Yes Nashley Cordoba L, PA  albuterol (VENTOLIN HFA) 108 (90 Base) MCG/ACT inhaler Inhale 2 puffs into the lungs every 6 (six) hours as needed for wheezing or shortness of breath. 02/22/22   Veryl Speak, FNP  BIKTARVY 50-200-25 MG TABS tablet Take 1 tablet by mouth daily. 02/22/22   Veryl Speak, FNP  cetirizine (ZYRTEC ALLERGY) 10 MG tablet Take 1 tablet (10 mg total) by mouth daily. 12/21/19   Wallis Bamberg, PA-C  hydrochlorothiazide (MICROZIDE) 12.5 MG capsule Take 1 capsule (12.5 mg total) by mouth daily. 02/22/22   Veryl Speak, FNP  famotidine (PEPCID) 20 MG tablet Take 1 tablet (20 mg total) by mouth 2 (two) times daily. Patient taking differently: Take 20 mg by mouth daily. 02/28/20 11/27/20  Veryl Speak, FNP    Family History Family History  Problem Relation Age of Onset   Cancer Mother     Social History Social History   Tobacco Use   Smoking status: Every Day    Packs/day: 0.50    Years: 10.00    Total pack years: 5.00    Types: Cigarettes   Smokeless tobacco: Never  Vaping Use  Vaping Use: Never used  Substance Use Topics   Alcohol use: Yes    Alcohol/week: 1.0 standard drink of alcohol    Types: 1 Cans of beer per week    Comment: socially   Drug use: Yes    Types: Marijuana, Cocaine    Comment: marijuana daily      Allergies   Patient has no known allergies.   Review of Systems Review of Systems  Constitutional:  Negative for fatigue and fever.  HENT:  Positive for voice change. Negative for congestion, rhinorrhea, sore throat and trouble swallowing.   Respiratory:  Positive for cough. Negative for shortness of breath.   Cardiovascular:  Negative for  chest pain.  Skin:  Negative for rash.  Neurological:  Negative for dizziness and headaches.     Physical Exam Triage Vital Signs ED Triage Vitals  Enc Vitals Group     BP 04/02/22 1544 125/86     Pulse Rate 04/02/22 1544 100     Resp 04/02/22 1544 17     Temp 04/02/22 1544 98 F (36.7 C)     Temp src --      SpO2 04/02/22 1544 96 %     Weight --      Height --      Head Circumference --      Peak Flow --      Pain Score 04/02/22 1543 0     Pain Loc --      Pain Edu? --      Excl. in GC? --    No data found.  Updated Vital Signs BP 125/86 (BP Location: Right Arm)   Pulse 100   Temp 98 F (36.7 C)   Resp 17   SpO2 96%   Visual Acuity Right Eye Distance:   Left Eye Distance:   Bilateral Distance:    Right Eye Near:   Left Eye Near:    Bilateral Near:     Physical Exam Vitals and nursing note reviewed.  Constitutional:      General: He is not in acute distress. HENT:     Head: Normocephalic.     Nose: Nose normal. No congestion.     Mouth/Throat:     Mouth: Mucous membranes are moist. No oral lesions.     Pharynx: Oropharynx is clear. Uvula midline. No pharyngeal swelling, oropharyngeal exudate, posterior oropharyngeal erythema or uvula swelling.     Comments: Voice hoarse Eyes:     Conjunctiva/sclera: Conjunctivae normal.     Pupils: Pupils are equal, round, and reactive to light.  Cardiovascular:     Rate and Rhythm: Normal rate and regular rhythm.     Heart sounds: Normal heart sounds.  Pulmonary:     Effort: Pulmonary effort is normal.     Breath sounds: Normal breath sounds.  Lymphadenopathy:     Cervical: No cervical adenopathy.  Skin:    Findings: No rash.  Neurological:     Mental Status: He is alert.     Gait: Gait normal.  Psychiatric:        Mood and Affect: Mood normal.      UC Treatments / Results  Labs (all labs ordered are listed, but only abnormal results are displayed) Labs Reviewed - No data to  display  EKG   Radiology No results found.  Procedures Procedures (including critical care time)  Medications Ordered in UC Medications - No data to display  Initial Impression / Assessment and Plan / UC Course  I have reviewed the triage vital signs and the nursing notes.  Pertinent labs & imaging results that were available during my care of the patient were reviewed by me and considered in my medical decision making (see chart for details).     Laryngitis - possible viral due to concomitant cough but also recent vocal strain. Discussed voice rest as much as possible. Rx steroids to reduce inflammation and improve hoarseness since pt may not be able to rest voice due to work requirements. Discussed return precautions.   E/M: 1 acute uncomplicated illness, no data, moderate risk due to prescription management  Final Clinical Impressions(s) / UC Diagnoses   Final diagnoses:  Laryngitis, acute     Discharge Instructions      Take prednisone as prescribed. Rest voice as much as possible and keep hydrated. Can try cough drops or throat coat tea. Follow-up with PCP or ENT if no improvement in a week or two. Go to the ER if develop difficulty breathing or inability to swallow.     ED Prescriptions     Medication Sig Dispense Auth. Provider   predniSONE (STERAPRED UNI-PAK 21 TAB) 10 MG (21) TBPK tablet Take as directed 1 each Vallery Sa, Elverna Caffee L, PA      PDMP not reviewed this encounter.   Estanislado Pandy, [place] 04/02/22 1620

## 2022-05-08 ENCOUNTER — Other Ambulatory Visit: Payer: Self-pay | Admitting: Family

## 2022-05-08 DIAGNOSIS — I10 Essential (primary) hypertension: Secondary | ICD-10-CM

## 2022-06-15 ENCOUNTER — Ambulatory Visit: Payer: Self-pay | Admitting: Family

## 2022-06-23 ENCOUNTER — Other Ambulatory Visit: Payer: Self-pay

## 2022-06-23 DIAGNOSIS — B20 Human immunodeficiency virus [HIV] disease: Secondary | ICD-10-CM

## 2022-06-23 DIAGNOSIS — Z113 Encounter for screening for infections with a predominantly sexual mode of transmission: Secondary | ICD-10-CM

## 2022-06-23 DIAGNOSIS — Z79899 Other long term (current) drug therapy: Secondary | ICD-10-CM

## 2022-06-24 LAB — T-HELPER CELL (CD4) - (RCID CLINIC ONLY)
CD4 % Helper T Cell: 56 % (ref 33–65)
CD4 T Cell Abs: 1001 /uL (ref 400–1790)

## 2022-06-25 ENCOUNTER — Telehealth: Payer: Self-pay

## 2022-06-25 NOTE — Telephone Encounter (Signed)
We received a call from [facility]. Please review patient's most recent RPR.  Allen [name]

## 2022-06-26 LAB — COMPLETE METABOLIC PANEL WITH GFR
AG Ratio: 1.7 (calc) (ref 1.0–2.5)
ALT: 14 U/L (ref 9–46)
AST: 14 U/L (ref 10–40)
Albumin: 4.1 g/dL (ref 3.6–5.1)
Alkaline phosphatase (APISO): 86 U/L (ref 36–130)
BUN: 10 mg/dL (ref 7–25)
CO2: 24 mmol/L (ref 20–32)
Calcium: 8.9 mg/dL (ref 8.6–10.3)
Chloride: 108 mmol/L (ref 98–110)
Creat: 1.03 mg/dL (ref 0.60–1.26)
Globulin: 2.4 g/dL (calc) (ref 1.9–3.7)
Glucose, Bld: 102 mg/dL — ABNORMAL HIGH (ref 65–99)
Potassium: 4 mmol/L (ref 3.5–5.3)
Sodium: 139 mmol/L (ref 135–146)
Total Bilirubin: 0.3 mg/dL (ref 0.2–1.2)
Total Protein: 6.5 g/dL (ref 6.1–8.1)
eGFR: 97 mL/min/{1.73_m2} (ref >=60)

## 2022-06-26 LAB — CBC WITH DIFFERENTIAL/PLATELET
Absolute Monocytes: 381 cells/uL (ref 200–950)
Basophils Absolute: 28 cells/uL (ref 0–200)
Basophils Relative: 0.6 %
Eosinophils Absolute: 160 cells/uL (ref 15–500)
Eosinophils Relative: 3.4 %
HCT: 45.4 % (ref 38.5–50.0)
Hemoglobin: 15.3 g/dL (ref 13.2–17.1)
Lymphs Abs: 2139 cells/uL (ref 850–3900)
MCH: 27.8 pg (ref 27.0–33.0)
MCHC: 33.7 g/dL (ref 32.0–36.0)
MCV: 82.5 fL (ref 80.0–100.0)
MPV: 10.3 fL (ref 7.5–12.5)
Monocytes Relative: 8.1 %
Neutro Abs: 1993 cells/uL (ref 1500–7800)
Neutrophils Relative %: 42.4 %
Platelets: 300 10*3/uL (ref 140–400)
RBC: 5.5 10*6/uL (ref 4.20–5.80)
RDW: 14.2 % (ref 11.0–15.0)
Total Lymphocyte: 45.5 %
WBC: 4.7 10*3/uL (ref 3.8–10.8)

## 2022-06-26 LAB — LIPID PANEL
Cholesterol: 131 mg/dL (ref <200)
HDL: 39 mg/dL — ABNORMAL LOW (ref >=40)
LDL Cholesterol (Calc): 64 mg/dL (calc)
Non-HDL Cholesterol (Calc): 92 mg/dL (calc) (ref <130)
Total CHOL/HDL Ratio: 3.4 (calc) (ref <5.0)
Triglycerides: 225 mg/dL — ABNORMAL HIGH (ref <150)

## 2022-06-26 LAB — FLUORESCENT TREPONEMAL AB(FTA)-IGG-BLD: Fluorescent Treponemal ABS: REACTIVE — AB

## 2022-06-26 LAB — HIV-1 RNA QUANT-NO REFLEX-BLD
HIV 1 RNA Quant: <20 Copies/mL — ABNORMAL HIGH
HIV-1 RNA Quant, Log: <1.3 Log cps/mL — ABNORMAL HIGH

## 2022-06-26 LAB — RPR: RPR Ser Ql: REACTIVE — AB

## 2022-06-26 LAB — RPR TITER: RPR Titer: 1:16 {titer} — ABNORMAL HIGH

## 2022-06-28 NOTE — Telephone Encounter (Signed)
Patient aware and scheduled for treatment on 11/21

## 2022-06-29 ENCOUNTER — Ambulatory Visit (INDEPENDENT_AMBULATORY_CARE_PROVIDER_SITE_OTHER): Payer: Self-pay

## 2022-06-29 ENCOUNTER — Other Ambulatory Visit: Payer: Self-pay

## 2022-06-29 DIAGNOSIS — A539 Syphilis, unspecified: Secondary | ICD-10-CM

## 2022-06-29 MED ORDER — PENICILLIN G BENZATHINE 1200000 UNIT/2ML IM SUSY
2.4000 10*6.[IU] | PREFILLED_SYRINGE | Freq: Once | INTRAMUSCULAR | Status: AC
  Administered 2022-06-29: 2.4 10*6.[IU] via INTRAMUSCULAR

## 2022-07-07 ENCOUNTER — Encounter (INDEPENDENT_AMBULATORY_CARE_PROVIDER_SITE_OTHER): Payer: Self-pay | Admitting: Family

## 2022-07-07 ENCOUNTER — Other Ambulatory Visit: Payer: Self-pay

## 2022-07-08 NOTE — Progress Notes (Signed)
Error

## 2022-07-09 ENCOUNTER — Other Ambulatory Visit: Payer: Self-pay

## 2022-07-09 ENCOUNTER — Encounter: Payer: Self-pay | Admitting: Family

## 2022-07-09 ENCOUNTER — Ambulatory Visit (INDEPENDENT_AMBULATORY_CARE_PROVIDER_SITE_OTHER): Payer: Self-pay | Admitting: Family

## 2022-07-09 DIAGNOSIS — A539 Syphilis, unspecified: Secondary | ICD-10-CM | POA: Insufficient documentation

## 2022-07-09 DIAGNOSIS — Z0000 Encounter for general adult medical examination without abnormal findings: Secondary | ICD-10-CM

## 2022-07-09 DIAGNOSIS — R0981 Nasal congestion: Secondary | ICD-10-CM

## 2022-07-09 DIAGNOSIS — I10 Essential (primary) hypertension: Secondary | ICD-10-CM

## 2022-07-09 DIAGNOSIS — B20 Human immunodeficiency virus [HIV] disease: Secondary | ICD-10-CM

## 2022-07-09 MED ORDER — BIKTARVY 50-200-25 MG PO TABS: 1.0000 | ORAL_TABLET | Freq: Every day | ORAL | 5 refills | Status: DC

## 2022-07-09 MED ORDER — HYDROCHLOROTHIAZIDE 12.5 MG PO CAPS: 12.5000 mg | ORAL_CAPSULE | Freq: Every day | ORAL | 5 refills | Status: AC

## 2022-07-09 MED ORDER — PREDNISONE 10 MG (21) PO TBPK: ORAL_TABLET | ORAL | 0 refills | Status: DC

## 2022-07-09 NOTE — Assessment & Plan Note (Signed)
Allen [name] has continued congestion refractory to cetirizine. Does not appear to have any evidence of infection. Will refill prednisone dosepak. Advised to continue OTC medications as needed for symptom relief and supportive care. Follow up if symptoms worsen or do not improve.

## 2022-07-09 NOTE — Assessment & Plan Note (Signed)
Giavonni has a positive RPR titer of 1:16 up from previous 1:2 indicating early latent syphilis as he has no rash or chancre. Treated with 2.4 million units Bicillin once on 06/29/22. Recheck RPR at next office visit.

## 2022-07-09 NOTE — Assessment & Plan Note (Signed)
Jeyden has not been able to get his hydrochlorothiazide recently. No neurological/ophthalmologic signs/symptoms. Refill hydrochlorothiazide. Recheck blood pressure at nurse visit for vaccinations.

## 2022-07-09 NOTE — Patient Instructions (Signed)
Nice to speak with you.  Continue to take your medication daily as prescribed.  Refills have been sent to the pharmacy.  Schedule nurse visit for Prevnar 20 and influenza vaccination - please have them check your blood pressure at that time.  Be sure to renew your financial assistance after January 1st.   Plan for follow up in 4 months or sooner if needed with lab work 1-2 weeks prior to appointment.    Have a great day and stay safe!

## 2022-07-09 NOTE — Assessment & Plan Note (Addendum)
Krystopher continues to have well controlled virus with good adherence and tolerance to [employer]. Reviewed lab work and discussed plan of care. Continue current dose of Biktarvy. Renew financial assistance after January 1st. Plan for follow up in 4 months or sooner if needed with lab work on 1-2 weeks prior to appointment.

## 2022-07-09 NOTE — Assessment & Plan Note (Signed)
Discussed importance of safe sexual practice and condom use. Condoms and STD testing offered.  Due for Prevnar 20 and Influenza vaccinations and will schedule nurse visit.

## 2022-07-09 NOTE — Progress Notes (Signed)
Brief Narrative   Patient ID: Allen [name]., male    DOB: 1986/05/15, 36 y.o.   MRN: [id number]  Allen [name] is a 36 y/o AA male diagnosed with HIV in 2018 with risk factor of tattoo or heterosexual contact. Initial CD4 and viral load are unavailable. [facility] initial lab work with undetectable viral load and CD4 count of 838. HLAB 5701 negative. No history of opportunistic infection. Previous ART regimen of Prezcobix and Descovy and switched to [facility] after experiencing gastrointestinal issues.    Subjective:    Chief Complaint  Patient presents with   Follow-up     Virtual Visit via Telephone/Video Note   I connected with Allen [name]. on 07/09/2022 at 9:23am  by telephone and verified that I am speaking with the correct person using two identifiers.   I discussed the limitations, risks, security and privacy concerns of performing an evaluation and management service by telephone and the availability of in person appointments. I also discussed with the patient that there may be a patient responsible charge related to this service. The patient expressed understanding and agreed to proceed.  Location:  Patient: Home Provider: [facility] HPI:  Allen [name]. is a 36 y.o. male with HIV disease last seen on 02/22/22 with well controlled virus and good adherence and tolerance to Biktarvy. Viral load was undetectable and CD4 count 1001. RPR titer was 1:16 with previous 1:2 indicating new infection and treated with 2.4 million units of Bicillin on 06/29/22. Kidney function, liver function and electrolytes within normal ranges. Lipid profile with triglycerides 225, LDL 64 and HDL 39. Televist today for routine follow up.   Allen [name] been doing well since his last office visit and currently [name] some increased congestion that [name] been refractory to cetirizine. Allen [name] as prescribed with no adverse side effects however [name] not been able to get his hydrochlorothiazide from  the pharmacy and [name] yet to call them. Will come in for Prevnar 20 and influneza vaccinations.  Denies fevers, chills, night sweats, headaches, changes in vision, neck pain/stiffness, nausea, diarrhea, vomiting, lesions or rashes.   No Known Allergies    Outpatient Medications Prior to Visit  Medication Sig Dispense Refill   albuterol (VENTOLIN HFA) 108 (90 Base) MCG/ACT inhaler Inhale 2 puffs into the lungs every 6 (six) hours as needed for wheezing or shortness of breath. 18 g 5   cetirizine (ZYRTEC ALLERGY) 10 MG tablet Take 1 tablet (10 mg total) by mouth daily. 30 tablet 0   BIKTARVY 50-200-25 MG TABS tablet Take 1 tablet by mouth daily. 30 tablet 5   hydrochlorothiazide (MICROZIDE) 12.5 MG capsule Take 1 capsule (12.5 mg total) by mouth daily. 30 capsule 5   predniSONE (STERAPRED UNI-PAK 21 TAB) 10 MG (21) TBPK tablet Take as directed 1 each 0   No facility-administered medications prior to visit.     Past Medical History:  Diagnosis Date   Anxiety    COVID    Fracture of metacarpal base of right hand, closed 11/2015   right small finger   GERD (gastroesophageal reflux disease)    no current med.   HIV (human immunodeficiency virus infection) ([place])    Hypertension      Past Surgical History:  Procedure Laterality Date   NO PAST SURGERIES     OPEN REDUCTION INTERNAL FIXATION (ORIF) METACARPAL Right 12/09/2015   Procedure: OPEN REDUCTION INTERNAL FIXATION (ORIF) RIGHT SMALL FINGER/METACARPAL;  Surgeon: Leanora Cover, MD;  Location: [facility];  Service: Orthopedics;  Laterality: Right;      Review of Systems  Constitutional:  Negative for appetite change, chills, fatigue, fever and unexpected weight change.  HENT:  Positive for congestion.   Eyes:  Negative for visual disturbance.  Respiratory:  Negative for cough, chest tightness, shortness of breath and wheezing.   Cardiovascular:  Negative for chest pain and leg swelling.  Gastrointestinal:  Negative  for abdominal pain, constipation, diarrhea, nausea and vomiting.  Genitourinary:  Negative for dysuria, flank pain, frequency, genital sores, hematuria and urgency.  Skin:  Negative for rash.  Allergic/Immunologic: Negative for immunocompromised state.  Neurological:  Negative for dizziness and headaches.      Objective:    Nursing note and vital signs reviewed.  Allen [name] is pleasant to speak with and in no apparent distress. Does sound congested. Physical exam limited secondary to telehealth visit.       02/22/2022    9:05 AM 11/03/2020    9:17 AM 10/06/2020    1:42 PM 02/28/2020   10:03 AM 03/20/2019    8:57 AM  Depression screen PHQ 2/9  Decreased Interest 3  0 0 0  Down, Depressed, Hopeless 1  0 0 0  PHQ - 2 Score 4  0 0 0  Altered sleeping 3      Tired, decreased energy 3      Change in appetite 3      Feeling bad or failure about yourself  1      Trouble concentrating       Moving slowly or fidgety/restless 1      Suicidal thoughts 0      PHQ-9 Score 15      Difficult doing work/chores Very difficult         Information is confidential and restricted. Go to Review Flowsheets to unlock data.       Assessment & Plan:    Patient Active Problem List   Diagnosis Date Noted   Syphilis (acquired) 07/09/2022   Substance induced mood disorder ([place]) 11/03/2020   Generalized anxiety disorder 11/03/2020   PTSD (post-traumatic stress disorder) 11/03/2020   Nasal congestion 02/28/2020   Gastroesophageal reflux 02/28/2020   Human immunodeficiency virus (HIV) disease ([place]) 03/20/2019   Healthcare maintenance 03/20/2019   Mild intermittent asthma without complication 61/44/3154   Essential hypertension 03/20/2019   Anxiety 03/20/2019     Problem List Items Addressed This Visit       Cardiovascular and Mediastinum   Essential hypertension    Allen [name] [name] not been able to get his hydrochlorothiazide recently. No neurological/ophthalmologic signs/symptoms. Refill  hydrochlorothiazide. Recheck blood pressure at nurse visit for vaccinations.       Relevant Medications   hydrochlorothiazide (MICROZIDE) 12.5 MG capsule     Other   Human immunodeficiency virus (HIV) disease ([place])    Allen [name] continues to have well controlled virus with good adherence and tolerance to [employer]. Reviewed lab work and discussed plan of care. Continue current dose of Biktarvy. Renew financial assistance after January 1st. Plan for follow up in 4 months or sooner if needed with lab work on 1-2 weeks prior to appointment.       Relevant Medications   BIKTARVY 50-200-25 MG TABS tablet   Other Relevant Orders   Basic metabolic panel   HIV-1 RNA quant-no reflex-bld   T-helper cell (CD4)- ([facility] only)   Healthcare maintenance    Discussed importance of safe sexual practice and condom use. Condoms and STD testing offered.  Due for Prevnar 20 and Influenza vaccinations and will schedule nurse visit.       Nasal congestion - Primary    Allen [name] [name] continued congestion refractory to cetirizine. Does not appear to have any evidence of infection. Will refill prednisone dosepak. Advised to continue OTC medications as needed for symptom relief and supportive care. Follow up if symptoms worsen or do not improve.       Syphilis (acquired)    Allen [name] [name] a positive RPR titer of 1:16 up from previous 1:2 indicating early latent syphilis as he [name] no rash or chancre. Treated with 2.4 million units Bicillin once on 06/29/22. Recheck RPR at next office visit.       Relevant Medications   BIKTARVY 50-200-25 MG TABS tablet   Other Relevant Orders   RPR     I am having Allen [name]. maintain his cetirizine, albuterol, Biktarvy, hydrochlorothiazide, and predniSONE.   Meds ordered this encounter  Medications   BIKTARVY 50-200-25 MG TABS tablet    Sig: Take 1 tablet by mouth daily.    Dispense:  30 tablet    Refill:  5    Please mail    Order Specific Question:    Supervising Provider    Answer:   Carlyle Basques [[id number]]   hydrochlorothiazide (MICROZIDE) 12.5 MG capsule    Sig: Take 1 capsule (12.5 mg total) by mouth daily.    Dispense:  30 capsule    Refill:  5    Please mail    Order Specific Question:   Supervising Provider    Answer:   Baxter Flattery, CYNTHIA [[id number]]   predniSONE (STERAPRED UNI-PAK 21 TAB) 10 MG (21) TBPK tablet    Sig: Take as directed    Dispense:  1 each    Refill:  0    Please mail    Order Specific Question:   Supervising Provider    Answer:   Carlyle Basques [phone]     I discussed the assessment and treatment plan with the patient. The patient was provided an opportunity to ask questions and all were answered. The patient agreed with the plan and demonstrated an understanding of the instructions.   The patient was advised to call back or seek an in-person evaluation if the symptoms worsen or if the condition fails to improve as anticipated.   I provided  9  minutes of non-face-to-face time during this encounter.  Follow-up: Return in about 4 months (around 11/08/2022), or if symptoms worsen or fail to improve.   Terri Piedra, MSN, FNP-C Nurse Practitioner [facility] [place] number: [phone]

## 2022-07-12 ENCOUNTER — Telehealth: Payer: Self-pay

## 2022-07-12 ENCOUNTER — Ambulatory Visit: Payer: Self-pay

## 2022-07-12 NOTE — Telephone Encounter (Signed)
Patient called to cancel nurse visit. Did not want to reschedule at the time, Will call back. Patient said he was supposed to have received 2 medication from [facility] on [address], one for blood pressure and a steroid for nasal congestion, best contact number is [phone]

## 2022-09-01 ENCOUNTER — Other Ambulatory Visit: Payer: Self-pay

## 2022-09-06 ENCOUNTER — Other Ambulatory Visit: Payer: Self-pay

## 2022-11-12 ENCOUNTER — Other Ambulatory Visit: Payer: Self-pay

## 2022-11-12 DIAGNOSIS — B20 Human immunodeficiency virus [HIV] disease: Secondary | ICD-10-CM

## 2022-11-12 DIAGNOSIS — A539 Syphilis, unspecified: Secondary | ICD-10-CM

## 2022-11-13 LAB — BASIC METABOLIC PANEL
BUN: 8 mg/dL (ref 7–25)
CO2: 26 mmol/L (ref 20–32)
Calcium: 9.6 mg/dL (ref 8.6–10.3)

## 2022-11-13 LAB — RPR TITER: RPR Titer: 1:8 {titer} — ABNORMAL HIGH

## 2022-11-16 LAB — T PALLIDUM AB: T Pallidum Abs: POSITIVE — AB

## 2022-11-16 LAB — T-HELPER CELLS (CD4) COUNT (NOT AT ARMC)
Absolute CD4: 1363 cells/uL (ref 490–1740)
CD4 T Helper %: 56 % (ref 30–61)
Total lymphocyte count: 2435 cells/uL (ref 850–3900)

## 2022-11-16 LAB — RPR: RPR Ser Ql: REACTIVE — AB

## 2022-11-16 LAB — HIV-1 RNA QUANT-NO REFLEX-BLD
HIV 1 RNA Quant: <20 Copies/mL — ABNORMAL HIGH
HIV-1 RNA Quant, Log: <1.3 Log cps/mL — ABNORMAL HIGH

## 2022-11-16 LAB — BASIC METABOLIC PANEL
Chloride: 105 mmol/L (ref 98–110)
Creat: 1 mg/dL (ref 0.60–1.26)
Glucose, Bld: 102 mg/dL — ABNORMAL HIGH (ref 65–99)
Potassium: 4.1 mmol/L (ref 3.5–5.3)
Sodium: 141 mmol/L (ref 135–146)

## 2022-11-22 ENCOUNTER — Ambulatory Visit (HOSPITAL_COMMUNITY): Payer: 59 | Admitting: Mental Health

## 2022-11-22 ENCOUNTER — Telehealth (HOSPITAL_COMMUNITY): Payer: Self-pay | Admitting: Mental Health

## 2022-11-22 NOTE — Telephone Encounter (Signed)
Therapist sent link for tele-assessment. No response after x 10 minutes. Contact pt via telephone who reported not interested in OPT and would like appointment for medication management at this time. Therapist informed would alert front desk to contact for appointment.
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
## 2022-12-01 ENCOUNTER — Ambulatory Visit (HOSPITAL_COMMUNITY): Payer: 59 | Admitting: Psychiatry

## 2022-12-01 ENCOUNTER — Encounter (HOSPITAL_COMMUNITY): Payer: Self-pay

## 2022-12-02 ENCOUNTER — Ambulatory Visit: Payer: Self-pay | Admitting: Family

## 2023-01-17 ENCOUNTER — Ambulatory Visit (HOSPITAL_COMMUNITY): Payer: 59 | Admitting: Psychiatry
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
## 2023-01-21 ENCOUNTER — Encounter (HOSPITAL_COMMUNITY): Payer: Self-pay | Admitting: Psychiatry

## 2023-01-21 ENCOUNTER — Ambulatory Visit (INDEPENDENT_AMBULATORY_CARE_PROVIDER_SITE_OTHER): Payer: 59 | Admitting: Psychiatry

## 2023-01-21 DIAGNOSIS — F1420 Cocaine dependence, uncomplicated: Secondary | ICD-10-CM

## 2023-01-21 DIAGNOSIS — F4310 Post-traumatic stress disorder, unspecified: Secondary | ICD-10-CM | POA: Diagnosis not present

## 2023-01-21 DIAGNOSIS — F1220 Cannabis dependence, uncomplicated: Secondary | ICD-10-CM | POA: Diagnosis not present

## 2023-01-21 DIAGNOSIS — F332 Major depressive disorder, recurrent severe without psychotic features: Secondary | ICD-10-CM

## 2023-01-21 DIAGNOSIS — F1090 Alcohol use, unspecified, uncomplicated: Secondary | ICD-10-CM

## 2023-01-21 MED ORDER — ESCITALOPRAM OXALATE 10 MG PO TABS: 10.0000 mg | ORAL_TABLET | Freq: Every day | ORAL | 2 refills | Status: DC

## 2023-01-21 MED ORDER — ARIPIPRAZOLE 5 MG PO TABS: 5.0000 mg | ORAL_TABLET | Freq: Every day | ORAL | 2 refills | Status: DC

## 2023-01-21 NOTE — Patient Instructions (Signed)
Thank you for attending your appointment today.  -- START Lexapro 10 mg daily -- START Abilify 5 mg daily -- Continue other medications as prescribed.  Please do not make any changes to medications without first discussing with your provider. If you are experiencing a psychiatric emergency, please call 911 or present to your nearest emergency department. Additional crisis, medication management, and therapy resources are included below.  [facility]  [address], [place], [place] [address] [phone] WALK-IN URGENT CARE 24/7 FOR ANYONE [address], [place], [place]  [phone] Fax: [phone] [email or web address] *Interpreters available *Accepts all insurance and uninsured for Urgent Care needs *Accepts Medicaid and uninsured for outpatient treatment (below)      ONLY FOR [facility]  Below:    Outpatient New Patient Assessment/Therapy Walk-ins:        Monday -Thursday 8am until slots are full.        Every Friday 1pm-4pm  (first come, first served)                   New Patient Psychiatry/Medication Management        Monday-Friday 8am-11am (first come, first served)               For all walk-ins we ask that you arrive by 7:15am, because patients will be seen in the order of arrival.

## 2023-01-25 ENCOUNTER — Other Ambulatory Visit: Payer: Self-pay

## 2023-01-25 ENCOUNTER — Other Ambulatory Visit (HOSPITAL_COMMUNITY): Payer: Self-pay | Admitting: Psychiatry

## 2023-01-25 MED ORDER — ESCITALOPRAM OXALATE 10 MG PO TABS
10.0000 mg | ORAL_TABLET | Freq: Every day | ORAL | 2 refills | Status: AC
  Filled 2023-01-25: qty 30, 30d supply, fill #0

## 2023-01-25 MED ORDER — ARIPIPRAZOLE 5 MG PO TABS
5.0000 mg | ORAL_TABLET | Freq: Every day | ORAL | 2 refills | Status: AC
  Filled 2023-01-25: qty 30, 30d supply, fill #0

## 2023-02-02 ENCOUNTER — Telehealth: Payer: Self-pay

## 2023-02-02 ENCOUNTER — Other Ambulatory Visit: Payer: Self-pay

## 2023-02-02 NOTE — Telephone Encounter (Signed)
Attempted to call patient to cancel lab appointment for today since labs were done in April. Not able to reach him at this time.  Will send mychart message. Juanita Laster, RMA

## 2023-02-17 ENCOUNTER — Ambulatory Visit: Payer: Self-pay | Admitting: Family

## 2023-02-17 ENCOUNTER — Other Ambulatory Visit: Payer: Self-pay

## 2023-02-17 DIAGNOSIS — Z113 Encounter for screening for infections with a predominantly sexual mode of transmission: Secondary | ICD-10-CM

## 2023-02-17 DIAGNOSIS — B20 Human immunodeficiency virus [HIV] disease: Secondary | ICD-10-CM

## 2023-02-22 ENCOUNTER — Other Ambulatory Visit: Payer: Self-pay

## 2023-02-22 DIAGNOSIS — B20 Human immunodeficiency virus [HIV] disease: Secondary | ICD-10-CM

## 2023-02-22 MED ORDER — BIKTARVY 50-200-25 MG PO TABS
1.0000 | ORAL_TABLET | Freq: Every day | ORAL | 0 refills | Status: DC
Start: 2023-02-22 — End: 2023-02-28
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
## 2023-02-23 ENCOUNTER — Telehealth (HOSPITAL_COMMUNITY): Payer: 59 | Admitting: Psychiatry

## 2023-02-23 ENCOUNTER — Encounter (HOSPITAL_COMMUNITY): Payer: Self-pay

## 2023-02-28 ENCOUNTER — Other Ambulatory Visit: Payer: Self-pay

## 2023-02-28 ENCOUNTER — Ambulatory Visit (INDEPENDENT_AMBULATORY_CARE_PROVIDER_SITE_OTHER): Payer: Self-pay | Admitting: Family

## 2023-02-28 ENCOUNTER — Encounter: Payer: Self-pay | Admitting: Family

## 2023-02-28 VITALS — Ht 68.0 in | Wt 215.0 lb

## 2023-02-28 DIAGNOSIS — Z0000 Encounter for general adult medical examination without abnormal findings: Secondary | ICD-10-CM

## 2023-02-28 DIAGNOSIS — Z113 Encounter for screening for infections with a predominantly sexual mode of transmission: Secondary | ICD-10-CM

## 2023-02-28 DIAGNOSIS — Z1212 Encounter for screening for malignant neoplasm of rectum: Secondary | ICD-10-CM

## 2023-02-28 DIAGNOSIS — A539 Syphilis, unspecified: Secondary | ICD-10-CM

## 2023-02-28 DIAGNOSIS — Z23 Encounter for immunization: Secondary | ICD-10-CM

## 2023-02-28 DIAGNOSIS — B20 Human immunodeficiency virus [HIV] disease: Secondary | ICD-10-CM

## 2023-02-28 LAB — CBC WITH DIFFERENTIAL/PLATELET
Basophils Absolute: 29 cells/uL (ref 0–200)
Eosinophils Absolute: 48 cells/uL (ref 15–500)
Eosinophils Relative: 1 %
MCH: 27.7 pg (ref 27.0–33.0)

## 2023-02-28 MED ORDER — BIKTARVY 50-200-25 MG PO TABS
1.0000 | ORAL_TABLET | Freq: Every day | ORAL | 5 refills | Status: AC
Start: 2023-02-28 — End: ?

## 2023-02-28 NOTE — Patient Instructions (Addendum)
Nice to see you. ? ?We will check your lab work today. ? ?Continue to take your medication daily as prescribed. ? ?Refills have been sent to the pharmacy. ? ?Plan for follow up in 6 months or sooner if needed with lab work on the same day. ? ?Have a great day and stay safe! ? ?

## 2023-02-28 NOTE — Assessment & Plan Note (Signed)
Allen [name] was treated with Bicillin for increased RPR titer of 1:16 with most recent being 1:8 and will recheck today with goal of 1:4 or less. May need to consider retreatment pending lab work results. Reviewed the basics of RPR testing and what to look for.

## 2023-02-28 NOTE — Assessment & Plan Note (Signed)
Allen [name] continues to have well controlled virus with good adherence and tolerance to [employer]. Reviewed previous lab work and discussed plan of care and U equals U. Check lab work. Renew financial assistance. Continue current dose of Biktarvy. Plan for follow up in 6 months or sooner if needed with lab work on the same day.

## 2023-02-28 NOTE — Progress Notes (Signed)
Brief Narrative   Patient ID: Allen Seitzinger., male    DOB: [birth year], 37 y.o.   MRN: [id number]  Allen [name] is a 37 y/o AA male diagnosed with HIV in 2018 with risk factor of tattoo or heterosexual contact. Initial CD4 and viral load are unavailable. [facility] initial lab work with undetectable viral load and CD4 count of 838. HLAB 5701 negative. No history of opportunistic infection. Previous ART regimen of Prezcobix and Descovy and switched to [facility] after experiencing gastrointestinal issues.    Subjective:    Chief Complaint  Patient presents with   Follow-up    B20   HIV Positive/AIDS    HPI:  Allen Shinault. is a 37 y.o. male with HIV disease last seen on 07/09/22 with well controlled virus and good adherence and tolerance to Biktarvy. Viral load was undetectable and CD4 count 1001. Renal function, liver function and electrolytes within normal ranges. RPR was increased from 1:2 to 1:16 and was treated with Bicillin on 06/2122. Here today for follow up.  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 Continues to take Biktarvy as prescribed with no adverse side effects. Would like RPR rechecked. Condoms and STD testing offered. Due to renew financial assistance/apply for Medicaid. Due for Prevnar 20 and rectal cancer screening.   Denies fevers, chills, night sweats, headaches, changes in vision, neck pain/stiffness, nausea, diarrhea, vomiting, lesions or rashes.    No Known Allergies    Outpatient Medications Prior to Visit  Medication Sig Dispense Refill   albuterol (VENTOLIN HFA) 108 (90 Base) MCG/ACT inhaler Inhale 2 puffs into the lungs every 6 (six) hours as needed for wheezing or shortness of breath. 18 g 5   ARIPiprazole (ABILIFY) 5 MG tablet Take 1 tablet (5 mg total) by mouth daily. 30 tablet 2   cetirizine (ZYRTEC ALLERGY) 10 MG tablet Take 1 tablet (10 mg total) by mouth daily.  30 tablet 0   escitalopram (LEXAPRO) 10 MG tablet Take 1 tablet (10 mg total) by mouth daily. 30 tablet 2   hydrochlorothiazide (MICROZIDE) 12.5 MG capsule Take 1 capsule (12.5 mg total) by mouth daily. 30 capsule 5   BIKTARVY 50-200-25 MG TABS tablet Take 1 tablet by mouth daily. 30 tablet 0   No facility-administered medications prior to visit.     Past Medical History:  Diagnosis Date   Anxiety    COVID    Depression    Fracture of metacarpal base of right hand, closed 11/2015   right small finger   GERD (gastroesophageal reflux disease)    no current med.   HIV (human immunodeficiency virus infection) (HCC)    Hypertension    PTSD (post-traumatic stress disorder)      Past Surgical History:  Procedure Laterality Date   NO PAST SURGERIES     OPEN REDUCTION INTERNAL FIXATION (ORIF) METACARPAL Right 12/09/2015   Procedure: OPEN REDUCTION INTERNAL FIXATION (ORIF) RIGHT SMALL FINGER/METACARPAL;  Surgeon: Betha Loa, MD;  Location: [facility];  Service: Orthopedics;  Laterality: Right;      Review of Systems  Constitutional:  Negative for chills, diaphoresis, fatigue and fever.  Respiratory:  Negative for cough, chest tightness, shortness of breath and wheezing.   Cardiovascular:  Negative for chest pain.  Gastrointestinal:  Negative for abdominal pain, diarrhea, nausea and vomiting.      Objective:    Ht 5\' 8"  (1.727 m)  Wt 215 lb (97.5 kg)   BMI 32.69 kg/m  Nursing note and vital signs reviewed.  Physical Exam Constitutional:      General: He is not in acute distress.    Appearance: He is well-developed.  Cardiovascular:     Rate and Rhythm: Normal rate and regular rhythm.     Heart sounds: Normal heart sounds.  Pulmonary:     Effort: Pulmonary effort is normal.     Breath sounds: Normal breath sounds.  Skin:    General: Skin is warm and dry.  Neurological:     Mental Status: He is alert.  Psychiatric:        Mood and Affect: Mood normal.          02/28/2023   11:00 AM 02/22/2022    9:05 AM 11/03/2020    9:17 AM 10/06/2020    1:42 PM 02/28/2020   10:03 AM  Depression screen PHQ 2/9  Decreased Interest 0 3  0 0  Down, Depressed, Hopeless 0 1  0 0  PHQ - 2 Score 0 4  0 0  Altered sleeping  3     Tired, decreased energy  3     Change in appetite  3     Feeling bad or failure about yourself   1     Trouble concentrating       Moving slowly or fidgety/restless  1     Suicidal thoughts  0     PHQ-9 Score  15     Difficult doing work/chores  Very difficult        Information is confidential and restricted. Go to Review Flowsheets to unlock data.       Assessment & Plan:    Patient Active Problem List   Diagnosis Date Noted   Cocaine use disorder, severe, dependence (HCC) 01/21/2023   Cannabis use disorder, severe, dependence (HCC) 01/21/2023   Alcohol use disorder 01/21/2023   Syphilis 07/09/2022   Substance induced mood disorder (HCC) 11/03/2020   Generalized anxiety disorder 11/03/2020   PTSD (post-traumatic stress disorder) 11/03/2020   Nasal congestion 02/28/2020   Gastroesophageal reflux 02/28/2020   Human immunodeficiency virus (HIV) disease (HCC) 03/20/2019   Healthcare maintenance 03/20/2019   Mild intermittent asthma without complication 03/20/2019   Essential hypertension 03/20/2019   Anxiety 03/20/2019     Problem List Items Addressed This Visit       Other   Human immunodeficiency virus (HIV) disease (HCC) - Primary    Allen [name] continues to have well controlled virus with good adherence and tolerance to [employer]. Reviewed previous lab work and discussed plan of care and U equals U. Check lab work. Renew financial assistance. Continue current dose of Biktarvy. Plan for follow up in 6 months or sooner if needed with lab work on the same day.       Relevant Medications   BIKTARVY 50-200-25 MG TABS tablet   Healthcare maintenance    Discussed importance of safe sexual practice and condom  use. Condoms and STD testing offered.  Prevnar 20 updated.  Rectal cancer screening completed.      Syphilis    Allen [name] was treated with Bicillin for increased RPR titer of 1:16 with most recent being 1:8 and will recheck today with goal of 1:4 or less. May need to consider retreatment pending lab work results. Reviewed the basics of RPR testing and what to look for.       Relevant Medications   BIKTARVY 50-200-25 MG  TABS tablet   Other Relevant Orders   RPR   Other Visit Diagnoses     Need for pneumococcal 20-valent conjugate vaccination       Relevant Orders   Pneumococcal conjugate vaccine 20-valent (Prevnar-20) (Completed)   Screening for venereal disease (VD)       Screening for rectal cancer       Relevant Orders   Cytology - PAP        I am having [name]. maintain his cetirizine, albuterol, hydrochlorothiazide, ARIPiprazole, escitalopram, and Biktarvy.   Meds ordered this encounter  Medications   BIKTARVY 50-200-25 MG TABS tablet    Sig: Take 1 tablet by mouth daily.    Dispense:  30 tablet    Refill:  5    Order Specific Question:   Supervising Provider    Answer:   Judyann Munson [[id number]]     Follow-up: Return in about 6 months (around 08/31/2023), or if symptoms worsen or fail to improve.   Marcos Eke, MSN, FNP-C Nurse Practitioner [facility] for Infectious Disease [facility] Main number: [phone]

## 2023-02-28 NOTE — Assessment & Plan Note (Signed)
Discussed importance of safe sexual practice and condom use. Condoms and STD testing offered.  Prevnar 20 updated.  Rectal cancer screening completed.

## 2023-03-01 LAB — COMPLETE METABOLIC PANEL WITH GFR
AG Ratio: 2 (calc) (ref 1.0–2.5)
Calcium: 10.2 mg/dL (ref 8.6–10.3)
Glucose, Bld: 138 mg/dL — ABNORMAL HIGH (ref 65–99)
Sodium: 136 mmol/L (ref 135–146)
Total Bilirubin: 0.4 mg/dL (ref 0.2–1.2)
Total Protein: 7.7 g/dL (ref 6.1–8.1)
eGFR: 80 mL/min/{1.73_m2} (ref >=60)

## 2023-03-01 LAB — RPR TITER: RPR Titer: 1:2 {titer} — ABNORMAL HIGH

## 2023-03-01 LAB — T-HELPER CELL (CD4) - (RCID CLINIC ONLY)
CD4 % Helper T Cell: 50 % (ref 33–65)
CD4 T Cell Abs: 1182 /uL (ref 400–1790)

## 2023-03-02 LAB — COMPLETE METABOLIC PANEL WITH GFR
ALT: 24 U/L (ref 9–46)
AST: 29 U/L (ref 10–40)
Albumin: 5.1 g/dL (ref 3.6–5.1)
Alkaline phosphatase (APISO): 78 U/L (ref 36–130)
BUN: 7 mg/dL (ref 7–25)
CO2: 23 mmol/L (ref 20–32)
Chloride: 101 mmol/L (ref 98–110)
Creat: 1.2 mg/dL (ref 0.60–1.26)
Globulin: 2.6 g/dL (calc) (ref 1.9–3.7)
Potassium: 4 mmol/L (ref 3.5–5.3)

## 2023-03-02 LAB — RPR: RPR Ser Ql: REACTIVE — AB

## 2023-03-02 LAB — CBC WITH DIFFERENTIAL/PLATELET
Absolute Monocytes: 379 cells/uL (ref 200–950)
Basophils Relative: 0.6 %
HCT: 42.3 % (ref 38.5–50.0)
Hemoglobin: 14.2 g/dL (ref 13.2–17.1)
Lymphs Abs: 2544 cells/uL (ref 850–3900)
MCHC: 33.6 g/dL (ref 32.0–36.0)
MCV: 82.5 fL (ref 80.0–100.0)
MPV: 9.9 fL (ref 7.5–12.5)
Monocytes Relative: 7.9 %
Neutro Abs: 1800 cells/uL (ref 1500–7800)
Neutrophils Relative %: 37.5 %
Platelets: 357 10*3/uL (ref 140–400)
RBC: 5.13 10*6/uL (ref 4.20–5.80)
RDW: 14.2 % (ref 11.0–15.0)
Total Lymphocyte: 53 %
WBC: 4.8 10*3/uL (ref 3.8–10.8)

## 2023-03-02 LAB — URINE CYTOLOGY ANCILLARY ONLY
Chlamydia: NEGATIVE
Comment: NEGATIVE
Comment: NORMAL
Neisseria Gonorrhea: NEGATIVE

## 2023-03-02 LAB — HIV-1 RNA QUANT-NO REFLEX-BLD
HIV 1 RNA Quant: <20 Copies/mL — ABNORMAL HIGH
HIV-1 RNA Quant, Log: <1.3 Log cps/mL — ABNORMAL HIGH

## 2023-03-02 LAB — T PALLIDUM AB: T Pallidum Abs: POSITIVE — AB

## 2023-03-03 LAB — CYTOLOGY - PAP: Diagnosis: NEGATIVE

## 2023-03-07 ENCOUNTER — Encounter (HOSPITAL_COMMUNITY): Payer: Self-pay

## 2023-03-07 ENCOUNTER — Ambulatory Visit (HOSPITAL_COMMUNITY): Payer: 59 | Admitting: Mental Health

## 2023-04-27 ENCOUNTER — Telehealth: Payer: Self-pay

## 2023-04-27 NOTE — Telephone Encounter (Signed)
Patient called asking if we knew of any ID clinics in [place], [place]. I told patient I could route to provider. Patient stated that he spoke to one clinic and they were asking if he had ADAP. Patient was confused and didn't know what that meant. Informed patient that if he doesn't have insurance he would qualify for [employer] and he should contact that clinic back to set up an appointment. Patient is reaching back out to clinic and will call back regarding referral.   Tlaloc Taddei P Kaliah Haddaway, CMA

## 2023-05-02 NOTE — Telephone Encounter (Signed)
  Transfer of ID care  Previous ID provider ([facility])   Most recent VL:  HIV 1 RNA Quant  Date Value Ref Range Status  02/28/2023 <20 (H) Copies/mL Final    Comment:    HIV-1 RNA Detected  11/12/2022 <20 (H) Copies/mL Final    Comment:    HIV-1 RNA Detected  06/23/2022 <20 (H) Copies/mL Final    Comment:    HIV-1 RNA Detected    Last Clinic Visit: 02/28/23  Current ART regimen: Biktarvy  Future appointments canceled   Does patient need medication refill to bridge until seen by new provider No or If yes, please specify: No patient has refills on file at pharmacy  Advised patient of process for ROI You'll need to authorize your new Infectious Disease provider to release your information from:   [facility] [address] [place], [place] [address] P: [phone] F: [phone]   New ID provider   Transition of Care to  [address], [place], [place] [address] Phone: [phone] Patient contact information updated in Epic

## 2023-05-19 NOTE — Telephone Encounter (Signed)
Notified [facility] that patient has transferred care.   Sandie Ano, RN

## 2023-08-31 ENCOUNTER — Ambulatory Visit: Payer: Self-pay | Admitting: Family

## 2024-02-22 ENCOUNTER — Telehealth: Payer: Self-pay

## 2024-02-22 NOTE — Telephone Encounter (Signed)
 Called pt trying to schedule an appointment, the number said not in service.
